# Patient Record
Sex: Female | Born: 1990 | Race: Black or African American | Hispanic: No | Marital: Single | Smoking: Never smoker
Health system: Southern US, Community
[De-identification: ages and names within clinical notes are randomized; demographics above are authoritative.]

## PROBLEM LIST (undated history)

## (undated) DIAGNOSIS — IMO0001 Reserved for inherently not codable concepts without codable children: Secondary | ICD-10-CM

## (undated) DIAGNOSIS — D649 Anemia, unspecified: Secondary | ICD-10-CM

## (undated) DIAGNOSIS — G43009 Migraine without aura, not intractable, without status migrainosus: Secondary | ICD-10-CM

## (undated) DIAGNOSIS — Z8619 Personal history of other infectious and parasitic diseases: Secondary | ICD-10-CM

## (undated) DIAGNOSIS — F32A Depression, unspecified: Secondary | ICD-10-CM

## (undated) DIAGNOSIS — E559 Vitamin D deficiency, unspecified: Secondary | ICD-10-CM

## (undated) DIAGNOSIS — E349 Endocrine disorder, unspecified: Secondary | ICD-10-CM

## (undated) DIAGNOSIS — E282 Polycystic ovarian syndrome: Secondary | ICD-10-CM

## (undated) DIAGNOSIS — F329 Major depressive disorder, single episode, unspecified: Secondary | ICD-10-CM

## (undated) HISTORY — DX: Reserved for inherently not codable concepts without codable children: IMO0001

## (undated) HISTORY — DX: Personal history of other infectious and parasitic diseases: Z86.19

## (undated) HISTORY — DX: Endocrine disorder, unspecified: E34.9

## (undated) HISTORY — DX: Depression, unspecified: F32.A

## (undated) HISTORY — PX: COLPOSCOPY: SHX161

## (undated) HISTORY — DX: Vitamin D deficiency, unspecified: E55.9

## (undated) HISTORY — PX: MOUTH SURGERY: SHX715

## (undated) HISTORY — DX: Major depressive disorder, single episode, unspecified: F32.9

## (undated) HISTORY — DX: Migraine without aura, not intractable, without status migrainosus: G43.009

## (undated) HISTORY — DX: Polycystic ovarian syndrome: E28.2

---

## 2010-12-20 ENCOUNTER — Emergency Department: Admit: 2010-12-20 | Payer: Self-pay | Source: Emergency Department | Admitting: Emergency Medicine

## 2010-12-26 ENCOUNTER — Emergency Department: Admit: 2010-12-26 | Payer: Self-pay | Source: Emergency Department | Admitting: Emergency Medicine

## 2013-08-23 ENCOUNTER — Other Ambulatory Visit: Payer: Self-pay | Admitting: Orthopaedic Surgery

## 2014-05-11 ENCOUNTER — Emergency Department
Admission: EM | Admit: 2014-05-11 | Discharge: 2014-05-11 | Disposition: A | Discharge status: Home / Self Care | Payer: BLUE CROSS/BLUE SHIELD | Attending: Emergency Medicine | Admitting: Emergency Medicine

## 2014-05-11 ENCOUNTER — Emergency Department: Payer: Self-pay

## 2014-05-11 ENCOUNTER — Emergency Department: Payer: BLUE CROSS/BLUE SHIELD

## 2014-05-11 DIAGNOSIS — R109 Unspecified abdominal pain: Secondary | ICD-10-CM

## 2014-05-11 DIAGNOSIS — D72829 Elevated white blood cell count, unspecified: Secondary | ICD-10-CM | POA: Insufficient documentation

## 2014-05-11 DIAGNOSIS — R1013 Epigastric pain: Secondary | ICD-10-CM | POA: Insufficient documentation

## 2014-05-11 DIAGNOSIS — R1110 Vomiting, unspecified: Secondary | ICD-10-CM | POA: Insufficient documentation

## 2014-05-11 LAB — CBC AND DIFFERENTIAL
Basophils Absolute Automated: 0.01 10*3/uL (ref 0.00–0.20)
Basophils Automated: 0 %
Eosinophils Absolute Automated: 0 10*3/uL (ref 0.00–0.70)
Eosinophils Automated: 0 %
Hematocrit: 44.9 % (ref 37.0–47.0)
Hgb: 15.6 g/dL (ref 12.0–16.0)
Immature Granulocytes Absolute: 0.03 10*3/uL
Immature Granulocytes: 0 %
Lymphocytes Absolute Automated: 1.07 10*3/uL (ref 0.50–4.40)
Lymphocytes Automated: 7 %
MCH: 31 pg (ref 28.0–32.0)
MCHC: 34.7 g/dL (ref 32.0–36.0)
MCV: 89.3 fL (ref 80.0–100.0)
MPV: 11 fL (ref 9.4–12.3)
Monocytes Absolute Automated: 0.51 10*3/uL (ref 0.00–1.20)
Monocytes: 3 %
Neutrophils Absolute: 13.82 10*3/uL — ABNORMAL HIGH (ref 1.80–8.10)
Neutrophils: 90 %
Nucleated RBC: 0 /100 WBC (ref 0–1)
Platelets: 228 10*3/uL (ref 140–400)
RBC: 5.03 10*6/uL (ref 4.20–5.40)
RDW: 13 % (ref 12–15)
WBC: 15.41 10*3/uL — ABNORMAL HIGH (ref 3.50–10.80)

## 2014-05-11 LAB — COMPREHENSIVE METABOLIC PANEL
ALT: 50 U/L (ref 0–55)
AST (SGOT): 40 U/L — ABNORMAL HIGH (ref 5–34)
Albumin/Globulin Ratio: 1.4 (ref 0.9–2.2)
Albumin: 4.9 g/dL (ref 3.5–5.0)
Alkaline Phosphatase: 106 U/L (ref 37–106)
Anion Gap: 14 (ref 5.0–15.0)
BUN: 19 mg/dL (ref 7.0–19.0)
Bilirubin, Total: 0.7 mg/dL (ref 0.2–1.2)
CO2: 24 mEq/L (ref 22–29)
Calcium: 10.7 mg/dL — ABNORMAL HIGH (ref 8.5–10.5)
Chloride: 102 mEq/L (ref 100–111)
Creatinine: 1 mg/dL (ref 0.6–1.0)
Globulin: 3.6 g/dL (ref 2.0–3.6)
Glucose: 111 mg/dL — ABNORMAL HIGH (ref 70–100)
Potassium: 3.5 mEq/L (ref 3.5–5.1)
Protein, Total: 8.5 g/dL — ABNORMAL HIGH (ref 6.0–8.3)
Sodium: 140 mEq/L (ref 136–145)

## 2014-05-11 LAB — POCT PREGNANCY TEST, URINE HCG: POCT Pregnancy HCG Test, UR: NEGATIVE

## 2014-05-11 LAB — POCT URINALYSIS AUTOMATED (IAH)
Glucose, UA POCT: NEGATIVE
Ketones, UA POCT: >=160 mg/dL — AB
Nitrite, UA POCT: NEGATIVE
PH, UA POCT: 6.5 (ref 4.6–8)
Protein, UA POCT: >=300 mg/dL — AB
Specific Gravity, UA POCT: >=1.03 mg/dL (ref 1.001–1.035)
Urine Leukocytes POCT: NEGATIVE
Urobilinogen, UA POCT: 1 mg/dL

## 2014-05-11 LAB — HEMOLYSIS INDEX: Hemolysis Index: 17 (ref 0–18)

## 2014-05-11 LAB — LIPASE: Lipase: 43 U/L (ref 8–78)

## 2014-05-11 LAB — GFR: EGFR: >60

## 2014-05-11 LAB — HCG QUANTITATIVE: hCG, Quant.: <1.2

## 2014-05-11 MED ORDER — IOHEXOL 350 MG/ML IV SOLN
100.0000 mL | Freq: Once | INTRAVENOUS | Status: AC | PRN
Start: 2014-05-11 — End: 2014-05-11
  Administered 2014-05-11: 100 mL via INTRAVENOUS

## 2014-05-11 MED ORDER — SODIUM CHLORIDE 0.9 % IV BOLUS
1000.0000 mL | Freq: Once | INTRAVENOUS | Status: DC
Start: 2014-05-11 — End: 2014-05-11

## 2014-05-11 MED ORDER — ACETAMINOPHEN-CODEINE 300-30 MG PO TABS
1.0000 | ORAL_TABLET | Freq: Four times a day (QID) | ORAL | Status: DC | PRN
Start: 2014-05-11 — End: 2015-08-17

## 2014-05-11 MED ORDER — MORPHINE SULFATE 2 MG/ML IJ/IV SOLN (WRAP)
4.0000 mg | Freq: Once | Status: AC
Start: 2014-05-11 — End: 2014-05-11
  Administered 2014-05-11: 4 mg via INTRAVENOUS
  Filled 2014-05-11: qty 2

## 2014-05-11 MED ORDER — ONDANSETRON 4 MG PO TBDP
4.0000 mg | ORAL_TABLET | Freq: Four times a day (QID) | ORAL | Status: AC | PRN
Start: 2014-05-11 — End: ?

## 2014-05-11 MED ORDER — SODIUM CHLORIDE 0.9 % IV BOLUS
2000.0000 mL | Freq: Once | INTRAVENOUS | Status: AC
Start: 2014-05-11 — End: 2014-05-11
  Administered 2014-05-11: 2000 mL via INTRAVENOUS

## 2014-05-11 MED ORDER — ONDANSETRON HCL 4 MG/2ML IJ SOLN
4.0000 mg | Freq: Once | INTRAMUSCULAR | Status: AC
Start: 2014-05-11 — End: 2014-05-11
  Administered 2014-05-11: 4 mg via INTRAVENOUS
  Filled 2014-05-11: qty 2

## 2014-05-11 MED ORDER — ACETAMINOPHEN-CODEINE #3 300-30 MG PO TABS
1.0000 | ORAL_TABLET | Freq: Once | ORAL | Status: AC
Start: 2014-05-11 — End: 2014-05-11
  Administered 2014-05-11: 1 via ORAL
  Filled 2014-05-11: qty 1

## 2014-05-11 NOTE — ED Provider Notes (Signed)
EMERGENCY DEPARTMENT HISTORY AND PHYSICAL EXAM     Physician/Midlevel provider first contact with patient: 05/11/14 1855         Date: 05/11/2014  Patient Name: Natalie Noble    History of Presenting Illness     Chief Complaint   Patient presents with   . Abdominal Pain   . Abnormal Lab   . Emesis       History Provided By: Patient, pt's mother    Chief Complaint: Abdominal pain  Onset: x 2 days  Timing: Constant  Location: Epigastrium  Severity: Moderate  Modifying Factors: None  Associated Symptoms: nausea, chills, loss of appetite, emesis    Additional History: Natalie Noble is a 23 y.o. female with PMHx of GERD c/o epigastric abdominal pain since x 2 days. She notes chills, loss of appetite, emesis x 2 days but denies any fever, diarrhea, or syncope. The patient was given Nexium at urgent care yesterday, but hasn't been able to take it due to nausea.    Patient is allergic to Erythromycin. Her family goes to [facility]. No chance of pregnancy noted by mother.    No IVDU. Pt smokes.     PCP: No primary care provider on file.      No current facility-administered medications for this encounter.     Current Outpatient Prescriptions   Medication Sig Dispense Refill   . Acetaminophen-Codeine (TYLENOL/CODEINE #3) 300-30 MG per tablet Take 1-2 tablets by mouth every 6 (six) hours as needed for Pain (as needed for pain or cough). 8 tablet 0   . ondansetron (ZOFRAN ODT) 4 MG disintegrating tablet Take 1 tablet (4 mg total) by mouth every 6 (six) hours as needed for Nausea. 8 tablet 0       Past History     Past Medical History:  Past Medical History   Diagnosis Date   . Reflux        Past Surgical History:  History reviewed. No pertinent past surgical history.    Family History:  History reviewed. No pertinent family history.    Social History:  History   Substance Use Topics   . Smoking status: Current Every Day Smoker   . Smokeless tobacco: Not on file   . Alcohol Use: Yes       Allergies:  Allergies    Allergen Reactions   . Erythromycin        Review of Systems     Review of Systems   Constitutional: Positive for chills and malaise/fatigue. Negative for fever.   HENT: Negative for nosebleeds.    Eyes: Negative for redness.   Respiratory: Negative for cough.    Cardiovascular: Negative for chest pain and leg swelling.   Gastrointestinal: Positive for nausea and vomiting. Negative for diarrhea.   Genitourinary: Negative for dysuria, urgency, frequency, hematuria and flank pain.   Musculoskeletal: Negative for myalgias and back pain.   Skin: Negative for rash.   Neurological: Positive for weakness. Negative for tremors, loss of consciousness and headaches.   Endo/Heme/Allergies: Negative for environmental allergies and polydipsia.   Psychiatric/Behavioral: Positive for substance abuse (tobacco). Negative for depression and suicidal ideas.         Physical Exam   BP 156/89 mmHg  Pulse 60  Temp(Src) 97 F (36.1 C) (Oral)  Resp 18  Ht 1.626 m  Wt 58.968 kg  BMI 22.30 kg/m2  SpO2 98%  LMP 04/02/2014    Constitutional: Vital signs reviewed. Pt looks uncomfortable and vomiting.  Head: Normocephalic, atraumatic  Eyes: Conjunctiva and sclera are normal.  No injection or discharge.  Ears, Nose, Throat:  Normal external examination of the nose and ears.  Mucous membranes moist.  Neck: Normal range of motion. Supple, no meningeal signs. Trachea midline.  Respiratory/Chest: Clear to auscultation. No respiratory distress.   Cardiovascular: Regular rate and rhythm. No murmurs.  Abdomen:  Bowel sounds intact. No rebound or guarding. Soft.  (+) epigastric tenderness. No tenderness elsewhere.  Back: No cva tenderness to percussion.  Upper Extremity:  No edema. No cyanosis. Bilateral radial pulses intact and equal.   Lower Extremity:  No edema. No cyanosis. Bilateral calves symmetrical and non-tender.   Skin: Warm and dry. No rash.  Neuro: A&Ox3. Moves all extremities spontaneously. Normal gait.   Psychiatric: Normal  affect.  Normal insight.      Diagnostic Study Results     Labs -     Results    Procedure Component Value Units Date/Time    UA POC (POCT UA Clinitek AX) [[id number]]  (Abnormal) Collected:  05/11/14 2046     Color UA POCT Amber Updated:  05/11/14 2047     Clarity UA POCT Clear      Glucose, UA POCT Negative      Bilirubin, UA POCT Small (A)      Ketones, UA POCT >=160 (A) mg/dL      Specific Gravity, UA POCT >=1.030 mg/dL      Blood, UA POCT  Small (A)      PH, UA POCT 6.5      Protein, UA POCT >=300 (A) mg/dL      Urobilinogen, UA POCT 1.0 mg/dL      Nitrite, UA POCT Negative      Leukocytes, UA POCT Negative     Urine HCG POC [[id number]] Collected:  05/11/14 2046     POCT QC Pass Updated:  05/11/14 2046     POCT Pregnancy HCG Test, UR Negative      Comment:        Result:        Negative Value is Normal in Healthy Males or Healthy non-pregnant Females    Beta HCG Quant Serum [[id number]] Collected:  05/11/14 1919     hCG, Quant. <1.2 Updated:  05/11/14 1950    Comprehensive Metabolic Panel (CMP) [[id number]]  (Abnormal) Collected:  05/11/14 1919    Specimen Information:  Blood Updated:  05/11/14 1945     Glucose 111 (H) mg/dL      BUN 84.6 mg/dL      Creatinine 1.0 mg/dL      Sodium 962 mEq/L      Potassium 3.5 mEq/L      Chloride 102 mEq/L      CO2 24 mEq/L      CALCIUM 10.7 (H) mg/dL      Protein, Total 8.5 (H) g/dL      Albumin 4.9 g/dL      AST (SGOT) 40 (H) U/L      ALT 50 U/L      Alkaline Phosphatase 106 U/L      Bilirubin, Total 0.7 mg/dL      Globulin 3.6 g/dL      Albumin/Globulin Ratio 1.4      Anion Gap 14.0     Lipase [[id number]] Collected:  05/11/14 1919    Specimen Information:  Blood Updated:  05/11/14 1945     Lipase 43 U/L     Hemolysis index [[id number]] Collected:  05/11/14 1919  Hemolysis Index 17 Updated:  05/11/14 1945    GFR [[id number]] Collected:  05/11/14 1919     EGFR >60.0 Updated:  05/11/14 1945    CBC and differential [[id number]]  (Abnormal) Collected:  05/11/14 1919    Specimen Information:   Blood / Blood Updated:  05/11/14 1927     WBC 15.41 (H) x10 3/uL      RBC 5.03 x10 6/uL      Hgb 15.6 g/dL      Hematocrit 69.6 %      MCV 89.3 fL      MCH 31.0 pg      MCHC 34.7 g/dL      RDW 13 %      Platelets 228 x10 3/uL      MPV 11.0 fL      Neutrophils 90 %      Lymphocytes Automated 7 %      Monocytes 3 %      Eosinophils Automated 0 %      Basophils Automated 0 %      Immature Granulocyte 0 %      Nucleated RBC 0 /100 WBC      Neutrophils Absolute 13.82 (H) x10 3/uL      Abs Lymph Automated 1.07 x10 3/uL      Abs Mono Automated 0.51 x10 3/uL      Abs Eos Automated 0.00 x10 3/uL      Absolute Baso Automated 0.01 x10 3/uL      Absolute Immature Granulocyte 0.03 x10 3/uL           Radiologic Studies -   Radiology Results (24 Hour)    Procedure Component Value Units Date/Time    CT Abd/ Pelvis with IV Contrast [[id number]] Collected:  05/11/14 2053    Order Status:  Completed Updated:  05/11/14 2100    Narrative:      CT ABDOMEN PELVIS W IV/ WO PO CONT    CLINICAL INDICATION: diffuse abdominal pain and vomiting. Hcg pending    COMPARISON: None available.    TECHNIQUE: Helical CT scan through the abdomen and pelvis was obtained  from the dome of the diaphragm to the symphysis pubis after the  uneventful administration of 100 cc of nonionic intravenous Omnipaque  350 and oral contrast.    FINDINGS:    . The liver, spleen, gallbladder, pancreas, adrenal glands,  and kidneys are normal for age.             There is no bowel obstruction or abnormal bowel dilatation. The  appendix is normal.    There is no ascites or abnormal fluid collection.      The abdominal aorta and common iliac arteries Are within normal limits  for age. No retroperitoneal mass or adenopathy is present. The lumbar  spine is within normal limits for age.      The urinary bladder is empty.. The uterus and ovaries are not  visualized.            Impression:       No evidence of acute intraperitoneal process.    Heron Nay, MD   05/11/2014 8:55  PM        .      Medical Decision Making   I am the first provider for this patient.    I reviewed the vital signs, available nursing notes, past medical history, past surgical history, family history and social history.    Vital Signs-Reviewed the patient's vital signs.  No data found.      Pulse Oximetry Analysis - Normal 98% on RA    Old Medical Records: Nursing notes.     ED Course:     9:11 PM - Patient states she is feeling better. Abdomen non-tender on repeat exam and pt tolerating PO. Discussed test results with pt and counseled on diagnosis, f/u plans, and signs and symptoms when to return to ED.  Pt is stable and ready for discharge.        Provider Notes: 80 yof presenting to ED with epigastric abdominal pain, vomiting, and leukocytosis. Felt much better after IV fluids, pain meds, and antiemetics. No acute process on CT abdomen. Abdominal pain resolved in the ED and she was tolerating PO intake. Will f/u with PMD and GI Monday (referral provided). Will f/u with PCP in 1-2 days. Reviewed red flags for return to ED and pt and mother voiced understanding.      Diagnosis     Clinical Impression:   1. Abdominal pain, unspecified abdominal location        _______________________________    Attestations:  This note is prepared by Selina Cooley acting as a Scribe for Maryella Shivers, MD.    Maryella Shivers, MD.  The scribe's documentation has been prepared under my direction and personally reviewed by me in its entirety.  I confirm that the note above accurately reflects all work, treatment, procedures, and medical decision making performed by me.    _______________________________          Maryella Shivers, MD  05/13/14 [phone]

## 2014-05-11 NOTE — Discharge Instructions (Signed)
Abdominal Pain     You have been diagnosed with abdominal (belly) pain. The cause of your pain is not yet known.     Many things can cause abdominal pain. Examples include viral infections and bowel (intestine) spasms. You might need another examination or more tests to find out why you have pain.     At this time, your pain does not seem to be caused by anything dangerous. You do not need surgery. You do not need to stay in the hospital.      Though we don’t believe your condition is dangerous right now, it is important to be careful. Sometimes a problem that seems mild can become serious later. This is why it is very important that you return here or go to the nearest Emergency Department unless you are 100% improved.     Return here or go to the nearest Emergency Department, or follow up with your physician in:  · 24 hours.     Drink only clear liquids such as water, clear broth, sports drinks, or clear caffeine-free soft drinks, like 7-Up or Sprite, for the next:  · 24 hours.     YOU SHOULD SEEK MEDICAL ATTENTION IMMEDIATELY, EITHER HERE OR AT THE NEAREST EMERGENCY DEPARTMENT, IF ANY OF THE FOLLOWING OCCURS:  · Your pain does not go away or gets worse.  · You cannot keep fluids down or your vomit is dark green.   · You vomit blood or see blood in your stool. Blood might be bright red or dark red. It can also be black and look like tar.  · You have a fever (temperature higher than 100.4ºF / 38ºC) or shaking chills.  · Your skin or eyes look yellow or your urine looks brown.  · You have severe diarrhea.

## 2014-05-11 NOTE — ED Notes (Addendum)
2 days of vomiting, unable to keep anything down, c/o black colored emesis, pt states mid epigastric tenderness, appears uncomfortable, came from clinic, elevated WBCs

## 2014-07-30 ENCOUNTER — Other Ambulatory Visit: Payer: Self-pay | Admitting: Specialist

## 2014-07-30 DIAGNOSIS — N6459 Other signs and symptoms in breast: Secondary | ICD-10-CM

## 2014-07-30 DIAGNOSIS — G4452 New daily persistent headache (NDPH): Secondary | ICD-10-CM

## 2015-02-16 ENCOUNTER — Emergency Department
Admission: EM | Admit: 2015-02-16 | Discharge: 2015-02-16 | Disposition: A | Discharge status: Home / Self Care | Payer: BLUE CROSS/BLUE SHIELD | Attending: Emergency Medicine | Admitting: Emergency Medicine

## 2015-02-16 ENCOUNTER — Emergency Department: Payer: BLUE CROSS/BLUE SHIELD

## 2015-02-16 DIAGNOSIS — K2970 Gastritis, unspecified, without bleeding: Secondary | ICD-10-CM | POA: Insufficient documentation

## 2015-02-16 DIAGNOSIS — K219 Gastro-esophageal reflux disease without esophagitis: Secondary | ICD-10-CM | POA: Insufficient documentation

## 2015-02-16 DIAGNOSIS — R112 Nausea with vomiting, unspecified: Secondary | ICD-10-CM

## 2015-02-16 DIAGNOSIS — F17210 Nicotine dependence, cigarettes, uncomplicated: Secondary | ICD-10-CM | POA: Insufficient documentation

## 2015-02-16 DIAGNOSIS — R1013 Epigastric pain: Secondary | ICD-10-CM

## 2015-02-16 LAB — POCT URINALYSIS AUTOMATED (IAH)
Glucose, UA POCT: NEGATIVE
Ketones, UA POCT: >=160 mg/dL — AB
Nitrite, UA POCT: NEGATIVE
PH, UA POCT: 7 (ref 4.6–8)
Protein, UA POCT: 30 mg/dL — AB
Specific Gravity, UA POCT: 1.025 mg/dL (ref 1.001–1.035)
Urine Leukocytes POCT: NEGATIVE
Urobilinogen, UA POCT: 0.2 mg/dL

## 2015-02-16 LAB — CBC AND DIFFERENTIAL
Basophils Absolute Automated: 0.01 10*3/uL (ref 0.00–0.20)
Basophils Automated: 0 %
Eosinophils Absolute Automated: 0 10*3/uL (ref 0.00–0.70)
Eosinophils Automated: 0 %
Hematocrit: 46.1 % (ref 37.0–47.0)
Hgb: 15.8 g/dL (ref 12.0–16.0)
Immature Granulocytes Absolute: 0.04 10*3/uL
Immature Granulocytes: 0 %
Lymphocytes Absolute Automated: 1.24 10*3/uL (ref 0.50–4.40)
Lymphocytes Automated: 9 %
MCH: 31.3 pg (ref 28.0–32.0)
MCHC: 34.3 g/dL (ref 32.0–36.0)
MCV: 91.3 fL (ref 80.0–100.0)
MPV: 11.2 fL (ref 9.4–12.3)
Monocytes Absolute Automated: 0.36 10*3/uL (ref 0.00–1.20)
Monocytes: 3 %
Neutrophils Absolute: 11.99 10*3/uL — ABNORMAL HIGH (ref 1.80–8.10)
Neutrophils: 88 %
Nucleated RBC: 0 /100 WBC (ref 0–1)
Platelets: 208 10*3/uL (ref 140–400)
RBC: 5.05 10*6/uL (ref 4.20–5.40)
RDW: 12 % (ref 12–15)
WBC: 13.6 10*3/uL — ABNORMAL HIGH (ref 3.50–10.80)

## 2015-02-16 LAB — COMPREHENSIVE METABOLIC PANEL
ALT: 36 U/L (ref 0–55)
AST (SGOT): 22 U/L (ref 5–34)
Albumin/Globulin Ratio: 1.4 (ref 0.9–2.2)
Albumin: 4.4 g/dL (ref 3.5–5.0)
Alkaline Phosphatase: 87 U/L (ref 37–106)
Anion Gap: 12 (ref 5.0–15.0)
BUN: 16 mg/dL (ref 7.0–19.0)
Bilirubin, Total: 0.7 mg/dL (ref 0.2–1.2)
CO2: 25 mEq/L (ref 22–29)
Calcium: 10.2 mg/dL (ref 8.5–10.5)
Chloride: 104 mEq/L (ref 100–111)
Creatinine: 0.9 mg/dL (ref 0.6–1.0)
Globulin: 3.1 g/dL (ref 2.0–3.6)
Glucose: 90 mg/dL (ref 70–100)
Potassium: 3.7 mEq/L (ref 3.5–5.1)
Protein, Total: 7.5 g/dL (ref 6.0–8.3)
Sodium: 141 mEq/L (ref 136–145)

## 2015-02-16 LAB — POCT PREGNANCY TEST, URINE HCG: POCT Pregnancy HCG Test, UR: NEGATIVE

## 2015-02-16 LAB — GFR: EGFR: >60

## 2015-02-16 LAB — LIPASE: Lipase: 29 U/L (ref 8–78)

## 2015-02-16 LAB — HEMOLYSIS INDEX: Hemolysis Index: 6 (ref 0–18)

## 2015-02-16 MED ORDER — SODIUM CHLORIDE 0.9 % IV BOLUS
1000.0000 mL | Freq: Once | INTRAVENOUS | Status: AC
Start: 2015-02-16 — End: 2015-02-16
  Administered 2015-02-16: 1000 mL via INTRAVENOUS

## 2015-02-16 MED ORDER — FAMOTIDINE 10 MG/ML IV SOLN (WRAP)
20.0000 mg | Freq: Once | INTRAVENOUS | Status: AC
Start: 2015-02-16 — End: 2015-02-16
  Administered 2015-02-16: 20 mg via INTRAVENOUS
  Filled 2015-02-16: qty 2

## 2015-02-16 MED ORDER — PROMETHAZINE HCL 25 MG PO TABS
12.5000 mg | ORAL_TABLET | Freq: Four times a day (QID) | ORAL | Status: AC | PRN
Start: 2015-02-16 — End: ?

## 2015-02-16 MED ORDER — ONDANSETRON HCL 4 MG/2ML IJ SOLN
4.0000 mg | Freq: Once | INTRAMUSCULAR | Status: AC
Start: 2015-02-16 — End: 2015-02-16
  Administered 2015-02-16: 4 mg via INTRAVENOUS
  Filled 2015-02-16: qty 2

## 2015-02-16 MED ORDER — PROMETHAZINE HCL 25 MG/ML IJ SOLN
12.5000 mg | Freq: Once | INTRAMUSCULAR | Status: AC
Start: 2015-02-16 — End: 2015-02-16
  Administered 2015-02-16: 12.5 mg via INTRAVENOUS
  Filled 2015-02-16: qty 1

## 2015-02-16 MED ORDER — ESOMEPRAZOLE MAGNESIUM 20 MG PO CPDR
20.0000 mg | DELAYED_RELEASE_CAPSULE | Freq: Every morning | ORAL | Status: AC
Start: 2015-02-16 — End: ?

## 2015-02-16 MED ORDER — TRAMADOL HCL 50 MG PO TABS
50.0000 mg | ORAL_TABLET | Freq: Four times a day (QID) | ORAL | Status: DC | PRN
Start: 2015-02-16 — End: 2015-08-17

## 2015-02-16 NOTE — Discharge Instructions (Signed)
Gastritis    You have been diagnosed with gastritis.    Gastritis is an irritation of the stomach lining. It has a number of causes including the use of aspirin and other anti-inflammatory medicines like ibuprofen (Advil or Motrin), naproxen (Naprosyn or Aleve), etc. Other causes are infections and too much stomach acid. Drinking alcohol can also cause gastritis.    You may be prescribed medicines to lower the amount of acid in the stomach or otherwise protect the stomach lining.    Use an antacid (Maalox and Mylanta) as directed on the bottle (1-2 tablespoons or 5-10 ml four times a day).    Avoid caffeine and alcohol. Avoid aspirin and other anti-inflammatory medicines like ibuprofen (Advil or Motrin), naproxen (Naprosyn or Aleve), etc. Acetaminophen (Tylenol) is safe for your stomach.    Spicy foods and acidic foods may increase pain. However, they do not damage the stomach lining. Avoid these foods if they cause pain.    You may be referred to a stomach specialist (Gastroenterologist) for further evaluation.    YOU SHOULD SEEK MEDICAL ATTENTION IMMEDIATELY, EITHER HERE OR AT THE NEAREST EMERGENCY DEPARTMENT, IF ANY OF THE FOLLOWING OCCURS:   Your pain suddenly gets worse.   You vomit (throw up) repeatedly or vomit blood or material that looks like "coffee grounds."   Any blood is in your stool or your stool gets very dark or looks like tar.   You develop a fever (temperature higher than 100.4F / 38C) or shaking chills.              Epigastric Abdominal Pain (Cause Unspecified)    You were treated for epigastric abdominal (belly) pain. We don t know the cause of the pain yet.    Epigastric pain is located in the center of your upper belly right under your ribs.     There are several common causes of epigastric abdominal pain. These may include:     Gastroesophageal reflux disease (GERD) or heartburn: This is the most common cause. It usually happens right after eating.       Gastritis: This is inflammation of your stomach.     Lactose Intolerance: This means the body can t digest lactose. This is found in milk and other dairy products.     Pancreatitis: This is when your pancreas gets inflamed. Often the pain can be felt in the back.     Ulcers in your stomach or intestine.    Your doctor diagnosed your condition by your physical exam and your history. You may have also had imaging or blood work done to make sure you don't have any serious problems.     Treatment of epigastric abdominal pain focuses on making symptoms better. Fluids and electrolytes (sodium and potassium) may be given through an IV. Pain medications may be given.     Your doctor may give you something to lower acid production or coat the stomach lining.    Acid Reducing Medications - These medications lower the amount of acid made in your stomach. This helps the inflammation in the stomach lining heal. Scheduling and dosing can vary, so follow the instructions carefully. Two of the common types of antacid medications are H2 blockers and proton pump inhibitors (PPIs). They include:    - H2 famotidine (Pepcid)  - H2 ranitidine (Zantac)  - PPI omeprazole (Prilosec)  - PPI lansoprazole (Prevacid)  - PPI esomeprazole (Nexium)  - PPI pantoprazole (Protonix)    Acid protective medications - These stick   to damaged ulcer tissue and protect against acid and enzymes. This way, the ulcer can heal.     - Sucralafate (Carafate).    You have been evaluated, treated, and observed by your doctor.Your doctor feels thatyour condition has stabilized and it is safe for you to go home.    Though we don't believe your condition is dangerous right now, it is important to be careful. Sometimes a problem that seems mild can become serious later. This is why it is very important that you return here or go to the nearest Emergency Department if you are not improving or your symptoms are getting worse.    Your  doctor may have you follow up with your primary care doctor as an outpatient to check your condition. Follow up as directed.    Your doctor may prescribe you pain medications to treat yourpain. You can use over-the-counter medicines like acetaminophen (Tylenol). It is important to follow the directions for taking these medications.    Stool softeners - These medications help with the constipation caused by narcotic medications. You can use over-the-counter laxatives like milk of magnesia or magnesium citrate.    YOU SHOULD SEEK MEDICAL ATTENTION IMMEDIATELY, EITHER HERE OR AT THE NEAREST EMERGENCY DEPARTMENT, IF ANY OF THE FOLLOWING OCCUR:   You have sudden severe pain in your belly or chest.   Your pain gets worse or does not go away.   You throw up blood or see blood in your stool. Blood may be bright red or dark black and tarry.   Your skin or eyes look yellow or your urine (pee) looks dark brown.   You get a fever (temperature higher than 100.4F or 38C) or shaking chills.     If you can t follow up with your doctor, or if at any time you feel you need to be rechecked or seen again, come back here or go to the nearest emergency department.

## 2015-02-16 NOTE — ED Provider Notes (Signed)
EMERGENCY DEPARTMENT HISTORY AND PHYSICAL EXAM     Physician/Midlevel provider first contact with patient: 02/16/15 2002         Date: 02/16/2015  Patient Name: Natalie Noble    History of Presenting Illness     Chief Complaint   Patient presents with   . Emesis     History Provided By: Patient    Chief Complaint: Nausea and vomiting  Onset: x 4 days  Timing: Constant  Severity: Moderate  Modifying Factors: None   Associated Symptoms: Subjective fever, abd pain, decreased appetite, mild CP, mild SOB, cough,     Additional History: Natalie Noble is a 24 y.o. female. C/O moderate constant nausea and vomiting that has been going on x 4 days, associated with abd pain, subjective fever, decreased appetite, mild CP, mild SOB, and cough. She states that her abd pain is epigastric. She states that she has had similar episodes of this in the past and it was a stomach bug. She denies having HA, dysuria, or hematuria. Pt is on depo. She states she smokes and drinks almost every day. No modifying factors.     PCP: No primary care provider on file.      No current facility-administered medications for this encounter.     Current Outpatient Prescriptions   Medication Sig Dispense Refill   . Acetaminophen-Codeine (TYLENOL/CODEINE #3) 300-30 MG per tablet Take 1-2 tablets by mouth every 6 (six) hours as needed for Pain (as needed for pain or cough). 8 tablet 0   . esomeprazole (NEXIUM) 20 MG capsule Take 1 capsule (20 mg total) by mouth every morning before breakfast. 30 capsule 0   . ondansetron (ZOFRAN ODT) 4 MG disintegrating tablet Take 1 tablet (4 mg total) by mouth every 6 (six) hours as needed for Nausea. 8 tablet 0   . promethazine (PHENERGAN) 25 MG tablet Take 0.5-1 tablets (12.5-25 mg total) by mouth every 6 (six) hours as needed for Nausea. 12 tablet 0   . traMADol (ULTRAM) 50 MG tablet Take 1 tablet (50 mg total) by mouth every 6 (six) hours as needed for Pain. Do not drive or operate machinery while taking this  medication 20 tablet 0       Past History     Past Medical History:  Past Medical History   Diagnosis Date   . Reflux        Past Surgical History:  History reviewed. No pertinent past surgical history.    Family History:  History reviewed. No pertinent family history.    Social History:  History   Substance Use Topics   . Smoking status: Current Every Day Smoker   . Smokeless tobacco: Not on file   . Alcohol Use: Yes       Allergies:  Allergies   Allergen Reactions   . Erythromycin        Review of Systems     Review of Systems   Constitutional: Positive for fever.   HENT: Negative for nosebleeds.    Eyes: Negative for discharge and redness.   Respiratory: Positive for shortness of breath (Mild).    Cardiovascular: Positive for chest pain (Mild).   Gastrointestinal: Positive for nausea, vomiting and abdominal pain.   Genitourinary: Negative for dysuria and hematuria.   Musculoskeletal: Positive for joint pain (for which pt takes ibuprofen).   Neurological: Negative for headaches.   Endo/Heme/Allergies:        Allergy to erythromycin   Psychiatric/Behavioral: Negative for suicidal ideas.  Physical Exam   BP 122/75 mmHg  Pulse 75  Temp(Src) 99.1 F (37.3 C)  Resp 16  Ht 5\' 4"  (1.626 m)  Wt 68.04 kg  BMI 25.73 kg/m2  SpO2 100%    Physical Exam   Constitutional: She is well-developed, well-nourished, and in no distress.   HENT:   Head: Normocephalic and atraumatic.   Mouth/Throat: Oropharynx is clear and moist.   Eyes: Conjunctivae are normal. Pupils are equal, round, and reactive to light.   Cardiovascular: Normal rate, regular rhythm and normal heart sounds.    No murmur heard.  Pulmonary/Chest: Effort normal and breath sounds normal. No respiratory distress.   Abdominal: Soft. She exhibits no distension. There is tenderness (mild epigastric ttp). There is no rebound and no guarding.   Musculoskeletal: Normal range of motion. She exhibits no edema.   Lymphadenopathy:     She has no cervical adenopathy.    Neurological: She is alert. GCS score is 15.   Skin: Skin is warm and dry.   Psychiatric: Mood and affect normal.   Nursing note and vitals reviewed.      Diagnostic Study Results     Labs -     Results     Procedure Component Value Units Date/Time    POCT Pregnancy Test, Urine HCG [[id number]] Collected:  02/16/15 2227     POCT QC Pass Updated:  02/16/15 2237     POCT Pregnancy HCG Test, UR Negative      Comment:        Result:        Negative Value is Normal in Healthy Males or Healthy non-pregnant Females    POCT UA Clinitek AX (urine dipstick) [[id number]]  (Abnormal) Collected:  02/16/15 2225     Color UA POCT Amber Updated:  02/16/15 2237     Clarity UA POCT Clear      Glucose, UA POCT Negative      Bilirubin, UA POCT Small (A)      Ketones, UA POCT >=160 (A) mg/dL      Specific Gravity, UA POCT 1.025 mg/dL      Blood, UA POCT  Trace - lysed (A)      PH, UA POCT 7.0      Protein, UA POCT =30 (A) mg/dL      Urobilinogen, UA POCT 0.2 mg/dL      Nitrite, UA POCT Negative      Leukocytes, UA POCT Negative     Comprehensive metabolic panel [[id number]] Collected:  02/16/15 2052    Specimen Information:  Blood Updated:  02/16/15 2116     Glucose 90 mg/dL      BUN 21.3 mg/dL      Creatinine 0.9 mg/dL      Sodium 086 mEq/L      Potassium 3.7 mEq/L      Chloride 104 mEq/L      CO2 25 mEq/L      CALCIUM 10.2 mg/dL      Protein, Total 7.5 g/dL      Albumin 4.4 g/dL      AST (SGOT) 22 U/L      ALT 36 U/L      Alkaline Phosphatase 87 U/L      Bilirubin, Total 0.7 mg/dL      Globulin 3.1 g/dL      Albumin/Globulin Ratio 1.4      Anion Gap 12.0     Lipase [[id number]] Collected:  02/16/15 2052    Specimen Information:  Blood Updated:  02/16/15 2116     Lipase 29 U/L     Hemolysis index [[id number]] Collected:  02/16/15 2052     Hemolysis Index 6 Updated:  02/16/15 2116    GFR [[id number]] Collected:  02/16/15 2052     EGFR >60.0 Updated:  02/16/15 2116    CBC with differential [[id number]]  (Abnormal) Collected:  02/16/15 2052     Specimen Information:  Blood / Blood Updated:  02/16/15 2102     WBC 13.60 (H) x10 3/uL      Hgb 15.8 g/dL      Hematocrit 46.9 %      Platelets 208 x10 3/uL      RBC 5.05 x10 6/uL      MCV 91.3 fL      MCH 31.3 pg      MCHC 34.3 g/dL      RDW 12 %      MPV 11.2 fL      Neutrophils 88 %      Lymphocytes Automated 9 %      Monocytes 3 %      Eosinophils Automated 0 %      Basophils Automated 0 %      Immature Granulocyte 0 %      Nucleated RBC 0 /100 WBC      Neutrophils Absolute 11.99 (H) x10 3/uL      Abs Lymph Automated 1.24 x10 3/uL      Abs Mono Automated 0.36 x10 3/uL      Abs Eos Automated 0.00 x10 3/uL      Absolute Baso Automated 0.01 x10 3/uL      Absolute Immature Granulocyte 0.04 x10 3/uL           Radiologic Studies -   Radiology Results (24 Hour)     ** No results found for the last 24 hours. **      .      Medical Decision Making   I am the first provider for this patient.    I reviewed the vital signs, available nursing notes, past medical history, past surgical history, family history and social history.    Vital Signs-Reviewed the patient's vital signs.     Patient Vitals for the past 12 hrs:   BP Temp Pulse Resp   02/16/15 2256 122/75 mmHg 99.1 F (37.3 C) 75 16   02/16/15 1953 158/89 mmHg 98.2 F (36.8 C) 60 17       Pulse Oximetry Analysis - Normal 100% on RA.    EKG:  Interpreted by the EP.   Time Interpreted: 2105   Rate: 56   Rhythm: Sinus bradycardia   Interpretation: no ischemic st segment abnormality, TWI V1-V3   Comparison: No prior study is available for comparison.    Old Medical Records: Old medical records.  Previous electrocardiograms.  Nursing notes.     CT abd/pelvis 05/2014 which was negative.     ED Course:   8:28 PM - Pt requesting Zofran before evaluation.  9:28 PM - Pt states she is feeling "so-so", but has no current requests. Will try to give urine sample.   11:15 PM - Pt is resting comfortably, discussed results, return precautions, and f/u plan. She agrees with plan.  Requests non NSAID medication for her knee pain.     Provider Notes:   Counseled on avoidance of exacerbating factors of gastritis - tobacco, alcohol, NSAID use    Procedures:    Core Measures:    Critical Care  Time:     Diagnosis     Clinical Impression:   1. Epigastric abdominal pain    2. Non-intractable vomiting with nausea, vomiting of unspecified type    3. Gastritis        _______________________________    This note is prepared by Rosine Abe acting as Scribe for Iva Boop, MD.    Iva Boop, MD.  The scribe's documentation has been prepared under my direction and personally reviewed by me in its entirety.  I confirm that the note above accurately reflects all work, treatment, procedures, and medical decision making performed by me.        _______________________________          Lorenza Cambridge, MD  02/20/15 [phone]

## 2015-02-17 LAB — ECG 12-LEAD
Atrial Rate: 56 {beats}/min
P Axis: 79 degrees
P-R Interval: 114 ms
Q-T Interval: 440 ms
QRS Duration: 82 ms
QTC Calculation (Bezet): 424 ms
R Axis: 72 degrees
T Axis: 23 degrees
Ventricular Rate: 56 {beats}/min

## 2015-07-01 ENCOUNTER — Encounter (INDEPENDENT_AMBULATORY_CARE_PROVIDER_SITE_OTHER): Payer: Self-pay

## 2015-07-01 NOTE — Progress Notes (Signed)
Individual Therapy Pre Screening Form  Date/Time: 07/01/2015, 6:15 PM  Interviewer: Lorain Childes    Patient Identification  Patient Name: Natalie Noble      Patient SSN: [id number]    Patient Phone Number(s):  [phone] (home)  , additional phone number:     Age:24 y.o.   DOB: [birth year]  Gender: female    Registration Related Information:   Planned Payment Method: Insurance    Referral Required: No  Have you confirmed your mental health benefits with your insurance? Yes    Referral Source: community  Can leave message?:  Yes    Why are you choosing to seek therapy at this time?: im going through a lot with my mom, i'm tired, constant fighting     Current support systems in life: no    Are you seeking long term therapy/more than 12 sessions:  No  Are you looking for court ordered or court recommended therapy? No  Are you wanting any type of evaluation/assessment completed? No  REFER OUT IF NECESSARY    Do you currently drink alcohol? Yes two or three times a week, two drinks    Do you currently or in the recent past use any non prescribed or illegal substances?  Marijuana once a week   Have you been in a program or seen a provider for substance abuse treatment including alcohol? No  REFER TO [facility]S IF APPROPRIATE    Do you ever worry about your memory?No  Have your family/friends ever expressed concern about your memory?No  REFER OUT IF APPROPRIATE    Do you currently have thoughts of hurting yourself?No  Have you had these thoughts in the past? Yes in the past     Are you currently on any medication? No  What are they?     Who is the prescribing doctor?     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    No    Time to call back: any  Referred out to: na  Therapist Assigned:   Appointment date and time

## 2015-08-16 ENCOUNTER — Observation Stay
Admission: EM | Admit: 2015-08-16 | Discharge: 2015-08-17 | Disposition: A | Discharge status: Home / Self Care | Payer: BLUE CROSS/BLUE SHIELD | Attending: Internal Medicine | Admitting: Internal Medicine

## 2015-08-16 ENCOUNTER — Observation Stay: Payer: BLUE CROSS/BLUE SHIELD | Admitting: Internal Medicine

## 2015-08-16 DIAGNOSIS — F1210 Cannabis abuse, uncomplicated: Secondary | ICD-10-CM | POA: Insufficient documentation

## 2015-08-16 DIAGNOSIS — F17200 Nicotine dependence, unspecified, uncomplicated: Secondary | ICD-10-CM | POA: Insufficient documentation

## 2015-08-16 DIAGNOSIS — R109 Unspecified abdominal pain: Secondary | ICD-10-CM | POA: Insufficient documentation

## 2015-08-16 DIAGNOSIS — R1115 Cyclical vomiting syndrome unrelated to migraine: Secondary | ICD-10-CM | POA: Diagnosis present

## 2015-08-16 DIAGNOSIS — R1013 Epigastric pain: Secondary | ICD-10-CM | POA: Insufficient documentation

## 2015-08-16 DIAGNOSIS — K219 Gastro-esophageal reflux disease without esophagitis: Secondary | ICD-10-CM | POA: Insufficient documentation

## 2015-08-16 DIAGNOSIS — G43A1 Cyclical vomiting, intractable: Principal | ICD-10-CM | POA: Insufficient documentation

## 2015-08-16 LAB — CBC AND DIFFERENTIAL
Basophils Absolute Automated: 0.01 10*3/uL (ref 0.00–0.20)
Basophils Automated: 0 %
Eosinophils Absolute Automated: 0 10*3/uL (ref 0.00–0.70)
Eosinophils Automated: 0 %
Hematocrit: 44.9 % (ref 37.0–47.0)
Hgb: 15.4 g/dL (ref 12.0–16.0)
Immature Granulocytes Absolute: 0.03 10*3/uL
Immature Granulocytes: 0 %
Lymphocytes Absolute Automated: 0.86 10*3/uL (ref 0.50–4.40)
Lymphocytes Automated: 7 %
MCH: 31.8 pg (ref 28.0–32.0)
MCHC: 34.3 g/dL (ref 32.0–36.0)
MCV: 92.6 fL (ref 80.0–100.0)
MPV: 10.7 fL (ref 9.4–12.3)
Monocytes Absolute Automated: 0.37 10*3/uL (ref 0.00–1.20)
Monocytes: 3 %
Neutrophils Absolute: 11.73 10*3/uL — ABNORMAL HIGH (ref 1.80–8.10)
Neutrophils: 90 %
Nucleated RBC: 0 /100 WBC (ref 0–1)
Platelets: 206 10*3/uL (ref 140–400)
RBC: 4.85 10*6/uL (ref 4.20–5.40)
RDW: 12 % (ref 12–15)
WBC: 12.97 10*3/uL — ABNORMAL HIGH (ref 3.50–10.80)

## 2015-08-16 LAB — URINALYSIS, REFLEX TO MICROSCOPIC EXAM IF INDICATED
Bilirubin, UA: NEGATIVE
Glucose, UA: NEGATIVE
Ketones UA: 20 — AB
Leukocyte Esterase, UA: NEGATIVE
Nitrite, UA: NEGATIVE
Protein, UR: 30 — AB
Specific Gravity UA: 1.02 (ref 1.001–1.035)
Urine pH: 7 (ref 5.0–8.0)
Urobilinogen, UA: NEGATIVE mg/dL

## 2015-08-16 LAB — COMPREHENSIVE METABOLIC PANEL
ALT: 43 U/L (ref 0–55)
AST (SGOT): 29 U/L (ref 5–34)
Albumin/Globulin Ratio: 1.5 (ref 0.9–2.2)
Albumin: 4.8 g/dL (ref 3.5–5.0)
Alkaline Phosphatase: 93 U/L (ref 37–106)
Anion Gap: 11 (ref 5.0–15.0)
BUN: 17 mg/dL (ref 7.0–19.0)
Bilirubin, Total: 0.7 mg/dL (ref 0.2–1.2)
CO2: 25 mEq/L (ref 22–29)
Calcium: 10.3 mg/dL (ref 8.5–10.5)
Chloride: 105 mEq/L (ref 100–111)
Creatinine: 0.8 mg/dL (ref 0.6–1.0)
Globulin: 3.1 g/dL (ref 2.0–3.6)
Glucose: 107 mg/dL — ABNORMAL HIGH (ref 70–100)
Potassium: 3.9 mEq/L (ref 3.5–5.1)
Protein, Total: 7.9 g/dL (ref 6.0–8.3)
Sodium: 141 mEq/L (ref 136–145)

## 2015-08-16 LAB — URINE HCG QUALITATIVE: Urine HCG Qualitative: NEGATIVE

## 2015-08-16 LAB — RAPID DRUG SCREEN, URINE
Barbiturate Screen, UR: NEGATIVE
Benzodiazepine Screen, UR: NEGATIVE
Cannabinoid Screen, UR: POSITIVE — AB
Cocaine, UR: NEGATIVE
Opiate Screen, UR: NEGATIVE
PCP Screen, UR: NEGATIVE
Urine Amphetamine Screen: NEGATIVE

## 2015-08-16 LAB — GFR: EGFR: >60

## 2015-08-16 LAB — HEMOLYSIS INDEX: Hemolysis Index: 10 (ref 0–18)

## 2015-08-16 LAB — LIPASE: Lipase: 50 U/L (ref 8–78)

## 2015-08-16 MED ORDER — FAMOTIDINE 10 MG/ML IV SOLN (WRAP)
20.0000 mg | Freq: Once | INTRAVENOUS | Status: AC
Start: 2015-08-16 — End: 2015-08-16
  Administered 2015-08-16: 20 mg via INTRAVENOUS
  Filled 2015-08-16: qty 2

## 2015-08-16 MED ORDER — FAMOTIDINE 20 MG PO TABS
20.0000 mg | ORAL_TABLET | Freq: Two times a day (BID) | ORAL | Status: DC
Start: 2015-08-16 — End: 2015-08-17
  Administered 2015-08-17: 20 mg via ORAL
  Filled 2015-08-16: qty 1

## 2015-08-16 MED ORDER — ONDANSETRON 4 MG PO TBDP
4.0000 mg | ORAL_TABLET | Freq: Three times a day (TID) | ORAL | Status: DC | PRN
Start: 2015-08-16 — End: 2015-08-17

## 2015-08-16 MED ORDER — SODIUM CHLORIDE 0.9 % IV BOLUS
1000.0000 mL | Freq: Once | INTRAVENOUS | Status: AC
Start: 2015-08-16 — End: 2015-08-16
  Administered 2015-08-16: 1000 mL via INTRAVENOUS

## 2015-08-16 MED ORDER — IBUPROFEN 400 MG PO TABS
400.0000 mg | ORAL_TABLET | Freq: Three times a day (TID) | ORAL | Status: DC | PRN
Start: 2015-08-16 — End: 2015-08-17

## 2015-08-16 MED ORDER — NALOXONE HCL 0.4 MG/ML IJ SOLN
0.2000 mg | INTRAMUSCULAR | Status: DC | PRN
Start: 2015-08-16 — End: 2015-08-17

## 2015-08-16 MED ORDER — PROMETHAZINE HCL 25 MG/ML IJ SOLN
12.5000 mg | Freq: Once | INTRAMUSCULAR | Status: AC
Start: 2015-08-16 — End: 2015-08-16
  Administered 2015-08-16: 12.5 mg via INTRAVENOUS
  Filled 2015-08-16: qty 1

## 2015-08-16 MED ORDER — FAMOTIDINE 10 MG/ML IV SOLN (WRAP)
20.0000 mg | Freq: Two times a day (BID) | INTRAVENOUS | Status: DC
Start: 2015-08-16 — End: 2015-08-17

## 2015-08-16 MED ORDER — SODIUM CHLORIDE 0.9 % IV SOLN
INTRAVENOUS | Status: DC
Start: 2015-08-16 — End: 2015-08-17

## 2015-08-16 MED ORDER — ONDANSETRON HCL 4 MG/2ML IJ SOLN
4.0000 mg | Freq: Three times a day (TID) | INTRAMUSCULAR | Status: DC | PRN
Start: 2015-08-16 — End: 2015-08-17
  Administered 2015-08-16 – 2015-08-17 (×3): 4 mg via INTRAVENOUS
  Filled 2015-08-16 (×3): qty 2

## 2015-08-16 MED ORDER — ACETAMINOPHEN 325 MG PO TABS
650.0000 mg | ORAL_TABLET | ORAL | Status: DC | PRN
Start: 2015-08-16 — End: 2015-08-17

## 2015-08-16 MED ORDER — ENOXAPARIN SODIUM 40 MG/0.4ML SC SOLN
40.0000 mg | Freq: Every day | SUBCUTANEOUS | Status: DC
Start: 2015-08-16 — End: 2015-08-17
  Administered 2015-08-16: 40 mg via SUBCUTANEOUS
  Filled 2015-08-16 (×2): qty 0.4

## 2015-08-16 MED ORDER — ACETAMINOPHEN 650 MG RE SUPP
650.0000 mg | RECTAL | Status: DC | PRN
Start: 2015-08-16 — End: 2015-08-17

## 2015-08-16 NOTE — H&P (Signed)
[facility]      Patient: Natalie Noble  Date: 08/16/2015   DOB: 1990/12/30  Admission Date: 08/16/2015   MRN: [id number]  Attending: Berenice Bouton         Chief Complaint   Patient presents with   . Abdominal Pain   . Emesis      History Gathered From: Patient    HISTORY AND PHYSICAL     Natalie Noble is a 24 y.o. female with a PMHx of GERD and Marijuana abuse who presented with N/V for the last 3 days. Pt had multiple ED visit and admission for the same reason and last time was in 06/2015. Pt smoke marijuana for long time and she said the last use was in the weekend but Tox screen +ve. Pt also complain for heart burn and pain all over her body.     Past Medical History   Diagnosis Date   . Reflux        History reviewed. No pertinent past surgical history.    Prior to Admission medications    Medication Sig Start Date End Date Taking? Authorizing Provider   DOXYCYCLINE CALCIUM PO Take by mouth.   Yes [provider]   ondansetron (ZOFRAN ODT) 4 MG disintegrating tablet Take 1 tablet (4 mg total) by mouth every 6 (six) hours as needed for Nausea. 05/11/14  Yes Maryella Shivers, MD   Acetaminophen-Codeine (TYLENOL/CODEINE #3) 300-30 MG per tablet Take 1-2 tablets by mouth every 6 (six) hours as needed for Pain (as needed for pain or cough). 05/11/14   Maryella Shivers, MD   esomeprazole (NEXIUM) 20 MG capsule Take 1 capsule (20 mg total) by mouth every morning before breakfast. 02/16/15   Lorenza Cambridge, MD   promethazine (PHENERGAN) 25 MG tablet Take 0.5-1 tablets (12.5-25 mg total) by mouth every 6 (six) hours as needed for Nausea. 02/16/15   Lorenza Cambridge, MD   traMADol (ULTRAM) 50 MG tablet Take 1 tablet (50 mg total) by mouth every 6 (six) hours as needed for Pain. Do not drive or operate machinery while taking this medication 02/16/15   Lorenza Cambridge, MD       Allergies   Allergen Reactions   . Erythromycin        CODE STATUS: full    PRIMARY CARE MD: Pcp, [facility], MD    History reviewed. No pertinent  family history.    Social History   Substance Use Topics   . Smoking status: Current Every Day Smoker   . Smokeless tobacco: None   . Alcohol Use: Yes       REVIEW OF SYSTEMS   Positive for: N/V and weakness  Negative for: fever, chills, blood in the vomit  All review of systems completed.    PHYSICAL EXAM     Vital Signs (most recent): BP 137/50 mmHg  Pulse 66  Temp(Src) 99.5 F (37.5 C) (Oral)  Resp 20  Ht 1.626 m (5\' 4" )  Wt 65.772 kg (145 lb)  BMI 24.88 kg/m2  SpO2 100%  Constitutional: No apparent distress. Patient speaks freely in full sentences.   HEENT: NC/AT, PERRL, no scleral icterus or conjunctival pallor, no nasal discharge, MMM, oropharynx without erythema or exudate  Neck: trachea midline, supple, no cervical or supraclavicular lymphadenopathy or masses  Cardiovascular: RRR, normal S1 S2, no murmurs, gallops, palpable thrills, no JVD, Non-displaced PMI.  Respiratory: Normal rate. No retractions or increased work of breathing. Clear to auscultation and  percussion bilaterally.  Gastrointestinal: +BS, non-distended, soft, non-tender, no rebound or guarding, no hepatosplenomegaly  Genitourinary: no suprapubic or costovertebral angle tenderness  Musculoskeletal: ROM and motor strength grossly normal. No clubbing, edema, or cyanosis. DP and radial pulses 2+ and symmetric.  Skin exam:  Normal not mottled or palor  Neurologic: EOMI, CN 2-12 grossly intact. no gross motor or sensory deficits  Psychiatric: AAOx3, affect and mood appropriate. The patient is alert, interactive, appropriate.  Capillary refill:  Normal    Exam done by Berenice Bouton, MD on 08/16/2015 at 7:53 PM      LABS & IMAGING     Recent Results (from the past 24 hour(s))   CBC with differential    Collection Time: 08/16/15  1:37 PM   Result Value Ref Range    WBC 12.97 (H) 3.50 - 10.80 x10 3/uL    Hgb 15.4 12.0 - 16.0 g/dL    Hematocrit 16.1 09.6 - 47.0 %    Platelets 206 140 - 400 x10 3/uL    RBC 4.85 4.20 - 5.40 x10 6/uL    MCV 92.6  80.0 - 100.0 fL    MCH 31.8 28.0 - 32.0 pg    MCHC 34.3 32.0 - 36.0 g/dL    RDW 12 12 - 15 %    MPV 10.7 9.4 - 12.3 fL    Neutrophils 90 None %    Lymphocytes Automated 7 None %    Monocytes 3 None %    Eosinophils Automated 0 None %    Basophils Automated 0 None %    Immature Granulocyte 0 None %    Nucleated RBC 0 0 - 1 /100 WBC    Neutrophils Absolute 11.73 (H) 1.80 - 8.10 x10 3/uL    Abs Lymph Automated 0.86 0.50 - 4.40 x10 3/uL    Abs Mono Automated 0.37 0.00 - 1.20 x10 3/uL    Abs Eos Automated 0.00 0.00 - 0.70 x10 3/uL    Absolute Baso Automated 0.01 0.00 - 0.20 x10 3/uL    Absolute Immature Granulocyte 0.03 0 x10 3/uL   Comprehensive metabolic panel    Collection Time: 08/16/15  1:37 PM   Result Value Ref Range    Glucose 107 (H) 70 - 100 mg/dL    BUN 04.5 7.0 - 40.9 mg/dL    Creatinine 0.8 0.6 - 1.0 mg/dL    Sodium 811 914 - 782 mEq/L    Potassium 3.9 3.5 - 5.1 mEq/L    Chloride 105 100 - 111 mEq/L    CO2 25 22 - 29 mEq/L    Calcium 10.3 8.5 - 10.5 mg/dL    Protein, Total 7.9 6.0 - 8.3 g/dL    Albumin 4.8 3.5 - 5.0 g/dL    AST (SGOT) 29 5 - 34 U/L    ALT 43 0 - 55 U/L    Alkaline Phosphatase 93 37 - 106 U/L    Bilirubin, Total 0.7 0.2 - 1.2 mg/dL    Globulin 3.1 2.0 - 3.6 g/dL    Albumin/Globulin Ratio 1.5 0.9 - 2.2    Anion Gap 11.0 5.0 - 15.0   Lipase    Collection Time: 08/16/15  1:37 PM   Result Value Ref Range    Lipase 50 8 - 78 U/L   Hemolysis index    Collection Time: 08/16/15  1:37 PM   Result Value Ref Range    Hemolysis Index 10 0 - 18   GFR    Collection Time: 08/16/15  1:37 PM   Result Value Ref Range    EGFR >60.0    UA, Reflex to Microscopic (pts 3 + yrs)    Collection Time: 08/16/15  4:30 PM   Result Value Ref Range    Urine Type Clean Catch     Color, UA Yellow Clear - Yellow    Clarity, UA Clear Clear - Hazy    Specific Gravity UA 1.020 1.001-1.035    Urine pH 7.0 5.0-8.0    Leukocyte Esterase, UA Negative Negative    Nitrite, UA Negative Negative    Protein, UR 30 (A) Negative    Glucose,  UA Negative Negative    Ketones UA 20 (A) Negative    Urobilinogen, UA Negative 0.2  -  2.0 mg/dL    Bilirubin, UA Negative Negative    Blood, UA Small (A) Negative    RBC, UA 6 - 10 (A) 0 - 5 /hpf    WBC, UA 0 - 5 0 - 5 /hpf    Squamous Epithelial Cells, Urine 0 - 5 0 - 25 /hpf   Urine HCG, Qualitative    Collection Time: 08/16/15  4:30 PM   Result Value Ref Range    Urine HCG Qualitative Negative Negative   Urine Tox Screen (Rapid Drug Screen)    Collection Time: 08/16/15  4:30 PM   Result Value Ref Range    Amphetamine Screen, UR Negative Negative    Barbiturate Screen, UR Negative Negative    Benzodiazepine Screen, UR Negative Negative    Cannabinoid Screen, UR Positive (A) Negative    Cocaine, UR Negative Negative    Opiate Screen, UR Negative Negative    PCP Screen, UR Negative Negative       MICROBIOLOGY:  Blood Culture:NO  Urine Culture: No  Antibiotics Started: No    IMAGING:  Upon my review: No results found.      CARDIAC:  EKG Interpretation (upon my review):  No results found.      Markers:        EMERGENCY DEPARTMENT COURSE:  Orders Placed This Encounter   Procedures   . CBC with differential   . Comprehensive metabolic panel   . Lipase   . UA, Reflex to Microscopic (pts 3 + yrs)   . Urine HCG, Qualitative   . Hemolysis index   . GFR   . CBC with differential   . Comprehensive metabolic panel   . Urine Tox Screen (Rapid Drug Screen)   . Lipase   . Hemolysis index   . GFR   . Troponin I   . ED [facility] Communication Order   . ECG 12 lead   . Saline lock IV   . Saline lock IV   . Place (admit) for Observation Services   . Place (admit) for Observation Services   . [facility] ED Bed Request   . [facility] ED Bed Request       ASSESSMENT & PLAN     Natalie Noble is a 24 y.o. female admitted under [facility] with N and V.    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  - Pt had multiple ED visit and admission for the same reason  and she had multiple test which was NL  - Will Start IVF  - Start Zofran  - H2B  - Council about Marijuana abuse  - Will try to avoid Opioid.    Marijuana and Nicotine abuse  -  Urine tox +ve  - Council about drug abuse          Nutrition  NPO    DVT/VTE Prophylaxis  Lovenox    Anticipated medical stability for discharge: 24h    Service status/Reason for ongoing hospitalization: N/V   Anticipated Discharge Needs: Tolerate PO     Signed,  Blayre Papania K Abisola Carrero    08/16/2015 7:53 PM  Time Elapsed: 50 min

## 2015-08-16 NOTE — ED Provider Notes (Signed)
EMERGENCY DEPARTMENT HISTORY AND PHYSICAL EXAM     Physician/Midlevel provider first contact with patient: 08/16/15 1311         Date: 08/16/2015  Patient Name: Natalie Noble    History of Presenting Illness     Chief Complaint   Patient presents with   . Abdominal Pain   . Emesis       History Provided By: Patient and Patient's Mother    Chief Complaint: Vomiting  Onset: Two days  Timing: Constant  Severity: Moderate  Exacerbating factors: None  Alleviating factors: Zofran with no significant relief  Associated Symptoms: Nausea, abdominal pain, constipation, dizziness, SOB, R sided CP, hot and cold flashes, and diaphoresis  Pertinent Negatives: No dysuria    Additional History: Natalie Noble is a 24 y.o. female with a history of PCOS presenting to the ED with constant vomiting which began two days ago. Pt's mother explains that the pt has visited the ED for similar symptoms before, the last time being in 05/2015. She recently had a gastric emptying study which they have not received the results for yet. Otherwise, the pt has been seen for similar symptoms multiple times before and all of her scans and tests have come back normal.She has also had an upper endoscopy which mother says was normal.  Pt also reports nausea, epigastric abdominal pain, dizziness, SOB, R sided CP, episodes of hot and cold flashes, and diaphoresis. She denies dysuria. Pt is on Depo birth control and does not get her periods. Her last depo was about 6 months ago. She also takes Doxycycline for acne and recently took Bactrim for a staph infection which has resolved.    PCP: Pcp, [facility], MD    No current facility-administered medications for this encounter.     Current Outpatient Prescriptions   Medication Sig Dispense Refill   . ondansetron (ZOFRAN ODT) 4 MG disintegrating tablet Take 1 tablet (4 mg total) by mouth every 6 (six) hours as needed for Nausea. 8 tablet 0   . esomeprazole (NEXIUM) 20 MG capsule Take 1 capsule (20 mg total)  by mouth every morning before breakfast. 30 capsule 0   . promethazine (PHENERGAN) 25 MG tablet Take 0.5-1 tablets (12.5-25 mg total) by mouth every 6 (six) hours as needed for Nausea. 12 tablet 0       Past History     Past Medical History:  Past Medical History   Diagnosis Date   . Reflux        Past Surgical History:  History reviewed. No pertinent past surgical history.    Family History:  History reviewed. No pertinent family history.    Social History:  Social History   Substance Use Topics   . Smoking status: Current Every Day Smoker   . Smokeless tobacco: None   . Alcohol Use: Yes       Allergies:  Allergies   Allergen Reactions   . Erythromycin        Review of Systems     Review of Systems   Constitutional: Positive for diaphoresis.        +"hot and cold flashes"   HENT: Negative for nosebleeds.    Eyes: Negative for discharge.   Respiratory: Positive for shortness of breath.    Cardiovascular: Positive for chest pain.   Gastrointestinal: Positive for nausea, vomiting, abdominal pain and constipation. Abdominal distention: epigastric.   Genitourinary: Negative for dysuria.   Skin: Negative for wound.   Neurological: Positive for dizziness.  Psychiatric/Behavioral: Negative for suicidal ideas.          Physical Exam   BP 123/70 mmHg  Pulse 69  Temp(Src) 98.7 F (37.1 C) (Oral)  Resp 18  Ht 5\' 4"  (1.626 m)  Wt 61.961 kg  BMI 23.44 kg/m2  SpO2 100%  Physical Exam   Constitutional: She is oriented to person, place, and time. She appears well-developed and well-nourished. She appears distressed.   HENT:   Head: Normocephalic and atraumatic.   Eyes: Right eye exhibits no discharge. Left eye exhibits no discharge. No scleral icterus.   Neck: Normal range of motion. Neck supple.   Cardiovascular: Normal rate, regular rhythm and normal heart sounds.    Pulmonary/Chest: Effort normal and breath sounds normal. No respiratory distress. She has no wheezes. She has no rales.   Abdominal: Soft. Bowel sounds are  normal. She exhibits no distension. There is tenderness (epigastric). There is no rebound and no guarding.   Musculoskeletal: She exhibits no edema or tenderness.   Neurological: She is alert and oriented to person, place, and time. She exhibits normal muscle tone.   Skin: Skin is warm and dry. She is not diaphoretic.   Psychiatric: Her speech is normal and behavior is normal. Judgment and thought content normal. Her mood appears anxious. Cognition and memory are normal.   Nursing note and vitals reviewed.        Diagnostic Study Results     Labs -     Results     Procedure Component Value Units Date/Time    Troponin I [[id number]] Collected:  08/17/15 0406     Troponin I <0.01 ng/mL Updated:  08/17/15 0512    Comprehensive metabolic panel [[id number]]  (Abnormal) Collected:  08/17/15 0406    Specimen Information:  Blood Updated:  08/17/15 0508     Glucose 83 mg/dL      BUN 78.2 mg/dL      Creatinine 0.7 mg/dL      Sodium 956 mEq/L      Potassium 3.5 mEq/L      Chloride 113 (H) mEq/L      CO2 21 (L) mEq/L      Calcium 8.9 mg/dL      Protein, Total 5.6 (L) g/dL      Albumin 3.2 (L) g/dL      AST (SGOT) 18 U/L      ALT 23 U/L      Alkaline Phosphatase 67 U/L      Bilirubin, Total 0.6 mg/dL      Globulin 2.4 g/dL      Albumin/Globulin Ratio 1.3      Anion Gap 7.0     Lipase [[id number]] Collected:  08/17/15 0406    Specimen Information:  Blood Updated:  08/17/15 0508     Lipase 50 U/L     Hemolysis index [[id number]] Collected:  08/17/15 0406     Hemolysis Index 11 Updated:  08/17/15 0508    GFR [[id number]] Collected:  08/17/15 0406     EGFR >60.0 Updated:  08/17/15 0508    CBC with differential [[id number]]  (Abnormal) Collected:  08/17/15 0406    Specimen Information:  Blood from Blood Updated:  08/17/15 0455     WBC 10.88 (H) x10 3/uL      Hgb 11.9 (L) g/dL      Hematocrit 03.4 (L) %      Platelets 157 x10 3/uL      RBC 3.88 (L) x10 6/uL      MCV  92.5 fL      MCH 30.7 pg      MCHC 33.1 g/dL      RDW 12 %      MPV 10.8 fL       Neutrophils 60 %      Lymphocytes Automated 31 %      Monocytes 9 %      Eosinophils Automated 0 %      Basophils Automated 0 %      Immature Granulocyte 0 %      Nucleated RBC 1 /100 WBC      Neutrophils Absolute 6.50 x10 3/uL      Abs Lymph Automated 3.34 x10 3/uL      Abs Mono Automated 1.00 x10 3/uL      Abs Eos Automated 0.02 x10 3/uL      Absolute Baso Automated 0.02 x10 3/uL      Absolute Immature Granulocyte 0.02 x10 3/uL     Basic Metabolic Panel [[id number]] Collected:  08/17/15 0406    Specimen Information:  Blood Updated:  08/17/15 0406          Radiologic Studies -   Radiology Results (24 Hour)     ** No results found for the last 24 hours. **      .    Medical Decision Making   I am the first provider for this patient.    I reviewed the vital signs, available nursing notes, past medical history, past surgical history, family history and social history.    Vital Signs-Reviewed the patient's vital signs.     No data found.      Pulse Oximetry Analysis - Normal 100% on RA    Cardiac Monitor:  Rate: 82  Rhythm: Sinus arrhythmia    EKG:  Interpreted by the EP.   Time Interpreted: 1354   Rate: 82   Rhythm: Sinus arrhythmia    Interpretation: No acute changes   Comparison: Not compared    Old Medical Records: Old medical records.     ED Course:     3:41 PM - Pt still reports epigastric pain.    5:03 PM - Pt's cannabinoid screen resulted as positive.    5:10 PM - When asked about marijuana use, the pt says that her vomiting began prior to her using marijuana. The last time she used was three days ago. She's had a sonrgam for gallbladder and says that her results were fine. The pt is currently spitting up phlegm, but not vomiting. Will give water.    5:49 PM - Pt did not drink the water and has been dry heaving.    6:19 PM - Discussed pt case with Dr. Alona Bene, [place], who accepts the pt and advises not to scan her, will observe.    7:14 PM - Pt is ambulating in the ED.    Provider Notes:       Diagnosis      Clinical Impression:   1. Intractable cyclical vomiting with nausea    2. Marijuana abuse    Treatment Plan:   ED Disposition     Admit Bed Type: General [8]  Admitting Physician: Berenice Bouton [[id number]]  Patient Class: Observation [104]              _______________________________      Attestations: This note is prepared by Adela Glimpse, acting as scribe for Azzie Glatter, MD. The scribe's documentation has been prepared under my direction and personally reviewed by me  in its entirety.  I confirm that the note above accurately reflects all work, treatment, procedures, and medical decision making performed by me.    _______________________________    Azzie Glatter, MD  08/19/15 [phone]

## 2015-08-16 NOTE — ED Notes (Addendum)
Vomiting, diffuse abd pain x2 days.  Denies dysuria or diarrhea. Unrelieved by zofran.

## 2015-08-17 LAB — CBC AND DIFFERENTIAL
Basophils Absolute Automated: 0.02 10*3/uL (ref 0.00–0.20)
Basophils Automated: 0 %
Eosinophils Absolute Automated: 0.02 10*3/uL (ref 0.00–0.70)
Eosinophils Automated: 0 %
Hematocrit: 35.9 % — ABNORMAL LOW (ref 37.0–47.0)
Hgb: 11.9 g/dL — ABNORMAL LOW (ref 12.0–16.0)
Immature Granulocytes Absolute: 0.02 10*3/uL
Immature Granulocytes: 0 %
Lymphocytes Absolute Automated: 3.34 10*3/uL (ref 0.50–4.40)
Lymphocytes Automated: 31 %
MCH: 30.7 pg (ref 28.0–32.0)
MCHC: 33.1 g/dL (ref 32.0–36.0)
MCV: 92.5 fL (ref 80.0–100.0)
MPV: 10.8 fL (ref 9.4–12.3)
Monocytes Absolute Automated: 1 10*3/uL (ref 0.00–1.20)
Monocytes: 9 %
Neutrophils Absolute: 6.5 10*3/uL (ref 1.80–8.10)
Neutrophils: 60 %
Nucleated RBC: 1 /100 WBC (ref 0–1)
Platelets: 157 10*3/uL (ref 140–400)
RBC: 3.88 10*6/uL — ABNORMAL LOW (ref 4.20–5.40)
RDW: 12 % (ref 12–15)
WBC: 10.88 10*3/uL — ABNORMAL HIGH (ref 3.50–10.80)

## 2015-08-17 LAB — COMPREHENSIVE METABOLIC PANEL
ALT: 23 U/L (ref 0–55)
AST (SGOT): 18 U/L (ref 5–34)
Albumin/Globulin Ratio: 1.3 (ref 0.9–2.2)
Albumin: 3.2 g/dL — ABNORMAL LOW (ref 3.5–5.0)
Alkaline Phosphatase: 67 U/L (ref 37–106)
Anion Gap: 7 (ref 5.0–15.0)
BUN: 11 mg/dL (ref 7.0–19.0)
Bilirubin, Total: 0.6 mg/dL (ref 0.2–1.2)
CO2: 21 mEq/L — ABNORMAL LOW (ref 22–29)
Calcium: 8.9 mg/dL (ref 8.5–10.5)
Chloride: 113 mEq/L — ABNORMAL HIGH (ref 100–111)
Creatinine: 0.7 mg/dL (ref 0.6–1.0)
Globulin: 2.4 g/dL (ref 2.0–3.6)
Glucose: 83 mg/dL (ref 70–100)
Potassium: 3.5 mEq/L (ref 3.5–5.1)
Protein, Total: 5.6 g/dL — ABNORMAL LOW (ref 6.0–8.3)
Sodium: 141 mEq/L (ref 136–145)

## 2015-08-17 LAB — TROPONIN I: Troponin I: <0.01 ng/mL (ref 0.00–0.09)

## 2015-08-17 LAB — LIPASE: Lipase: 50 U/L (ref 8–78)

## 2015-08-17 LAB — GFR: EGFR: >60

## 2015-08-17 LAB — HEMOLYSIS INDEX: Hemolysis Index: 11 (ref 0–18)

## 2015-08-17 MED ORDER — ONDANSETRON 4 MG PO TBDP
4.0000 mg | ORAL_TABLET | Freq: Three times a day (TID) | ORAL | Status: DC | PRN
Start: 2015-08-17 — End: 2015-08-17

## 2015-08-17 NOTE — Progress Notes (Signed)
The patient and or patient's representative were informed both verbally and in writing that they are placed in Observation/Outpatient status.  A copy of the written notification was placed in the shadow chart.  Donnette Macmullen Ext [phone]

## 2015-08-17 NOTE — Plan of Care (Signed)
Problem: Symptom Management  Goal: Adequate hydration  Patient come with diagnoses of nausea and vomiting. after she came to the floor she complain of nausea no vomiting noted, Zofran 4 mg iv times one given. Her vital sign within normal limites. She is npo, iv fluid running at 100 ml/hr.stady gate go to bath room independently.   Plan-: continue monitoring intake and out put.

## 2015-08-17 NOTE — Final Progress Note (DC Note for stay less than 48 (Signed)
Date:08/17/2015   Patient Name: Natalie Noble T  Attending Physician: Berenice Bouton, MD  Today:   BP 122/63 mmHg  Pulse 82  Temp(Src) 98 F (36.7 C) (Oral)  Resp 18  Ht 1.626 m (5\' 4" )  Wt 61.961 kg (136 lb 9.6 oz)  BMI 23.44 kg/m2  SpO2 99%  Ranges for the last 24 hours:  Temp:  [98 F (36.7 C)-99.5 F (37.5 C)] 98 F (36.7 C)  Heart Rate:  [66-83] 82  Resp Rate:  [14-20] 18  BP: (122-137)/(50-98) 122/63 mmHg    Date of Admission:   08/16/2015    Date of Discharge:   08/16/2017  Outcome of Hospitalization:   Active Problems:    Intractable cyclical vomiting with nausea  Resolved Problems:    * No resolved hospital problems. *     Significant Events: Nausea, vomiting, improvement.  Patient tolerated regular diet requested to be discharged home    Lab Results last 48 Hours     Procedure Component Value Units Date/Time    Troponin I [[id number]] Collected:  08/17/15 0406     Troponin I <0.01 ng/mL Updated:  08/17/15 0512    Comprehensive metabolic panel [[id number]]  (Abnormal) Collected:  08/17/15 0406    Specimen Information:  Blood Updated:  08/17/15 0508     Glucose 83 mg/dL      BUN 78.2 mg/dL      Creatinine 0.7 mg/dL      Sodium 956 mEq/L      Potassium 3.5 mEq/L      Chloride 113 (H) mEq/L      CO2 21 (L) mEq/L      Calcium 8.9 mg/dL      Protein, Total 5.6 (L) g/dL      Albumin 3.2 (L) g/dL      AST (SGOT) 18 U/L      ALT 23 U/L      Alkaline Phosphatase 67 U/L      Bilirubin, Total 0.6 mg/dL      Globulin 2.4 g/dL      Albumin/Globulin Ratio 1.3      Anion Gap 7.0     Lipase [[id number]] Collected:  08/17/15 0406    Specimen Information:  Blood Updated:  08/17/15 0508     Lipase 50 U/L     Hemolysis index [[id number]] Collected:  08/17/15 0406     Hemolysis Index 11 Updated:  08/17/15 0508    GFR [[id number]] Collected:  08/17/15 0406     EGFR >60.0 Updated:  08/17/15 0508    CBC with differential [[id number]]  (Abnormal) Collected:  08/17/15 0406    Specimen Information:  Blood from Blood Updated:   08/17/15 0455     WBC 10.88 (H) x10 3/uL      Hgb 11.9 (L) g/dL      Hematocrit 03.4 (L) %      Platelets 157 x10 3/uL      RBC 3.88 (L) x10 6/uL      MCV 92.5 fL      MCH 30.7 pg      MCHC 33.1 g/dL      RDW 12 %      MPV 10.8 fL      Neutrophils 60 %      Lymphocytes Automated 31 %      Monocytes 9 %      Eosinophils Automated 0 %      Basophils Automated 0 %      Immature Granulocyte 0 %  Nucleated RBC 1 /100 WBC      Neutrophils Absolute 6.50 x10 3/uL      Abs Lymph Automated 3.34 x10 3/uL      Abs Mono Automated 1.00 x10 3/uL      Abs Eos Automated 0.02 x10 3/uL      Absolute Baso Automated 0.02 x10 3/uL      Absolute Immature Granulocyte 0.02 x10 3/uL     CBC with differential [[id number]] Collected:  08/17/15 0406    Specimen Information:  Blood from Blood Updated:  08/17/15 0425    Basic Metabolic Panel [[id number]] Collected:  08/17/15 0406    Specimen Information:  Blood Updated:  08/17/15 0406    Urine Tox Screen (Rapid Drug Screen) [[id number]]  (Abnormal) Collected:  08/16/15 1630    Specimen Information:  Urine Updated:  08/16/15 1654     Amphetamine Screen, UR Negative      Barbiturate Screen, UR Negative      Benzodiazepine Screen, UR Negative      Cannabinoid Screen, UR Positive (A)      Cocaine, UR Negative      Opiate Screen, UR Negative      PCP Screen, UR Negative     Urine HCG, Qualitative [[id number]] Collected:  08/16/15 1630    Specimen Information:  Urine Updated:  08/16/15 1650     Urine HCG Qualitative Negative     UA, Reflex to Microscopic (pts 3 + yrs) [[id number]]  (Abnormal) Collected:  08/16/15 1630    Specimen Information:  Urine Updated:  08/16/15 1649     Urine Type Clean Catch      Color, UA Yellow      Clarity, UA Clear      Specific Gravity UA 1.020      Urine pH 7.0      Leukocyte Esterase, UA Negative      Nitrite, UA Negative      Protein, UR 30 (A)      Glucose, UA Negative      Ketones UA 20 (A)      Urobilinogen, UA Negative mg/dL      Bilirubin, UA Negative      Blood, UA  Small (A)      RBC, UA 6 - 10 (A) /hpf      WBC, UA 0 - 5 /hpf      Squamous Epithelial Cells, Urine 0 - 5 /hpf     Comprehensive metabolic panel [[id number]]  (Abnormal) Collected:  08/16/15 1337    Specimen Information:  Blood Updated:  08/16/15 1411     Glucose 107 (H) mg/dL      BUN 73.2 mg/dL      Creatinine 0.8 mg/dL      Sodium 202 mEq/L      Potassium 3.9 mEq/L      Chloride 105 mEq/L      CO2 25 mEq/L      Calcium 10.3 mg/dL      Protein, Total 7.9 g/dL      Albumin 4.8 g/dL      AST (SGOT) 29 U/L      ALT 43 U/L      Alkaline Phosphatase 93 U/L      Bilirubin, Total 0.7 mg/dL      Globulin 3.1 g/dL      Albumin/Globulin Ratio 1.5      Anion Gap 11.0     Lipase [[id number]] Collected:  08/16/15 1337    Specimen Information:  Blood Updated:  08/16/15 1411  Lipase 50 U/L     Hemolysis index [[id number]] Collected:  08/16/15 1337     Hemolysis Index 10 Updated:  08/16/15 1411    GFR [[id number]] Collected:  08/16/15 1337     EGFR >60.0 Updated:  08/16/15 1411    CBC with differential [[id number]]  (Abnormal) Collected:  08/16/15 1337    Specimen Information:  Blood from Blood Updated:  08/16/15 1353     WBC 12.97 (H) x10 3/uL      Hgb 15.4 g/dL      Hematocrit 27.2 %      Platelets 206 x10 3/uL      RBC 4.85 x10 6/uL      MCV 92.6 fL      MCH 31.8 pg      MCHC 34.3 g/dL      RDW 12 %      MPV 10.7 fL      Neutrophils 90 %      Lymphocytes Automated 7 %      Monocytes 3 %      Eosinophils Automated 0 %      Basophils Automated 0 %      Immature Granulocyte 0 %      Nucleated RBC 0 /100 WBC      Neutrophils Absolute 11.73 (H) x10 3/uL      Abs Lymph Automated 0.86 x10 3/uL      Abs Mono Automated 0.37 x10 3/uL      Abs Eos Automated 0.00 x10 3/uL      Absolute Baso Automated 0.01 x10 3/uL      Absolute Immature Granulocyte 0.03 x10 3/uL           Procedures performed:   Radiology: all results from this admission  No results found.  Radiology: all results in the last 7 days  No results found.  Radiology: all  results in the last 24 hours  No results found.  Surgery: all results from this admission  * No surgery found *    Treatment Team:   Attending Provider: Berenice Bouton, MD  Consulting Physician: Solon Palm, MD    Disposition:   Disposition: Home or Self Care    Condition at Discharge:   good     Follow up Recommendations for Receiving Provider     1. Follow out his primary care doctor  2. Considered to stop using recreational drugs  Unresulted Labs     Procedure . . . Date/Time    CBC with differential [[id number]] Collected:  08/17/15 0406    Specimen Information:  Blood from Blood Updated:  08/17/15 0425    Basic Metabolic Panel [[id number]] Collected:  08/17/15 0406    Specimen Information:  Blood Updated:  08/17/15 0406          Discharge Instructions:     Follow-up Information     Follow up with Pcp, [facility], MD .        Peripheral IV 08/16/15 Left Antecubital (Active)   Site Assessment Clean;Dry;Intact 08/17/2015  8:15 AM   Line Status Infusing 08/17/2015  8:15 AM   Dressing Status Clean;Dry;Intact 08/17/2015  8:15 AM   Dressing Change Due 08/23/15 08/17/2015  8:15 AM   Reason Not Rotated Not due 08/16/2015  9:23 PM   Number of days:1          There is no immunization history on file for this patient.  Extended Emergency Contact Information  Primary Emergency Contact: [place]  Home Phone: [phone]  Mobile Phone: [phone]  Relation: Mother  Full Code  Activity/Weight bearing status: As tolerated     Discharge Medication List      Taking          esomeprazole 20 MG capsule   Dose:  20 mg   Commonly known as:  NexIUM   Take 1 capsule (20 mg total) by mouth every morning before breakfast.       * ondansetron 4 MG disintegrating tablet   Dose:  4 mg   What changed:  Another medication with the same name was added. Make sure you understand how and when to take each.   Commonly known as:  ZOFRAN ODT   Take 1 tablet (4 mg total) by mouth every 6 (six) hours as needed for  Nausea.       * ondansetron 4 MG disintegrating tablet   Dose:  4 mg   What changed:  You were already taking a medication with the same name, and this prescription was added. Make sure you understand how and when to take each.   Commonly known as:  ZOFRAN-ODT   Take 1 tablet (4 mg total) by mouth every 8 (eight) hours as needed.       promethazine 25 MG tablet   Dose:  12.5-25 mg   Commonly known as:  PHENERGAN   Take 0.5-1 tablets (12.5-25 mg total) by mouth every 6 (six) hours as needed for Nausea.       * Notice:  This list has 2 medication(s) that are the same as other medications prescribed for you. Read the directions carefully, and ask your doctor or other care provider to review them with you.      STOP taking these medications          Acetaminophen-Codeine 300-30 MG per tablet   Commonly known as:  TYLENOL/CODEINE #3       DOXYCYCLINE CALCIUM PO       traMADol 50 MG tablet   Commonly known as:  ULTRAM             Signed by: Solon Palm, MD

## 2015-08-17 NOTE — Progress Notes (Signed)
Patient discharged home. No special discharge needs. No prescriptions for this patient. Discharge instructions given to patient and family and reviewed. No questions at this time. Patient to follow up with PCP. Patient declined wheelchair transport and left unit independently on foot accompanied by family

## 2015-08-17 NOTE — UM Notes (Signed)
Place (admit) for Observation Services Once     08/16/15 1823     24 y.o. female with a PMHx of GERD and Marijuana abuse who presented with N/V for the last 3 days. Pt had multiple ED visit and admission for the same reason and last time was in 06/2015. Pt smoke marijuana for long time and she said the last use was in the weekend but Tox screen +ve. Pt also complain for heart burn and pain all over her body.     Vital Signs (most recent): BP 137/50 mmHg  Pulse 66  Temp(Src) 99.5 F (37.5 C) (Oral)  Resp 20  Ht 1.626 m (5\' 4" )  Wt 65.772 kg (145 lb)  BMI 24.88 kg/m2  SpO2 100%    WBC 3.50 - 10.80 x10 3/uL 12.97 (H)           Protein, UR Negative  30 (A)    Glucose, UA Negative  Negative    Ketones UA Negative  20 (A)    Urobilinogen, UA 0.2 - 2.0 mg/dL Negative    Bilirubin, UA Negative  Negative    Blood, UA Negative  Small (A)    RBC, UA 0 - 5 /hpf 6 - 10 (A)            Chloride 100 - 111 mEq/L 113 (H) 105 104 102    CO2 22 - 29 mEq/L 21 (L) 25 25 24     Calcium 8.5 - 10.5 mg/dL 8.9 16.1 09.6 04.5 (H)    Protein, Total 6.0 - 8.3 g/dL 5.6 (L) 7.9 7.5 8.5 (H)    Albumin 3.5 - 5.0 g/dL 3.2 (L)               Cannabinoid Screen, UR Negative  Positive (A)           ER MEDS:   08/16/2015 1335 sodium chloride 0.9 % bolus 1,000 mL 1,000 mL Intravenous [address] Elpidio Anis, RN        08/16/2015 1530 sodium chloride 0.9 % bolus 1,000 mL 0 mL Intravenous Stopped Gianan, Ana Rogelia Boga, RN completed.      08/16/2015 1437 sodium chloride 0.9 % bolus 1,000 mL 1,000 mL Intravenous [address] Elpidio Anis, RN       08/16/2015 1345 promethazine (PHENERGAN) injection 12.5 mg 12.5 mg Intravenous Given Elpidio Anis, RN       08/16/2015 1925 sodium chloride 0.9 % bolus 1,000 mL 0 mL Intravenous Stopped Gianan, Irena Reichmann, RN completed.      08/16/2015 1539 sodium chloride 0.9 % bolus 1,000 mL 1,000 mL Intravenous [address] [place], [place]       08/16/2015 1609 famotidine (PEPCID) injection 20  mg 20 mg Intravenous Given          MEDS:  enoxaparin (LOVENOX) syringe 40 mg  Dose: 40 mg Freq: Daily Route: SC  0.9% NaCl infusion  Rate: 100 mL/hr Freq: Continuous Route: IV  ondansetron (ZOFRAN) injection 4 mg  Dose: 4 mg Freq: Every 8 hours PRN Route: IV-X2    10/8-CLEAR LIQ DIET. PT C/O OF NAUSEA. NO VOMITING.    [place], UR CM, ACM, CCM. Phone [phone]

## 2015-08-17 NOTE — Plan of Care (Signed)
Problem: Safety  Goal: Patient will be free from injury during hospitalization  Outcome: Progressing  Safe environment provided. Hourly rounding discussed and done. Call bell, table and phone within reach.     Fall prevention plan discussed and interventions in place.         Problem: Pain  Goal: Patient's pain/discomfort is manageable  Outcome: Progressing  Patient denies pain throughout shift. Will continue to assess for pain and provide meds as needed as ordered.        Comments:   Patient remains nauseated throughout shift. IV zofran given when available with good relief. Patient taking CLD.     Ambulates independently.     Will continue to assess and notify MD for changes outside normal limits.

## 2015-08-18 LAB — ECG 12-LEAD
Atrial Rate: 82 {beats}/min
P Axis: 78 degrees
P-R Interval: 130 ms
Q-T Interval: 406 ms
QRS Duration: 70 ms
QTC Calculation (Bezet): 474 ms
R Axis: 67 degrees
T Axis: 21 degrees
Ventricular Rate: 82 {beats}/min

## 2016-09-22 ENCOUNTER — Encounter: Payer: Self-pay | Admitting: Obstetrics and Gynecology

## 2016-09-22 ENCOUNTER — Ambulatory Visit (INDEPENDENT_AMBULATORY_CARE_PROVIDER_SITE_OTHER): Payer: BLUE CROSS/BLUE SHIELD | Admitting: Obstetrics and Gynecology

## 2016-09-22 VITALS — BP 102/60 | HR 80 | Resp 13 | Ht 64.5 in | Wt 158.0 lb

## 2016-09-22 DIAGNOSIS — N915 Oligomenorrhea, unspecified: Secondary | ICD-10-CM | POA: Diagnosis not present

## 2016-09-22 DIAGNOSIS — O926 Galactorrhea: Secondary | ICD-10-CM | POA: Diagnosis not present

## 2016-09-22 DIAGNOSIS — Z0000 Encounter for general adult medical examination without abnormal findings: Secondary | ICD-10-CM | POA: Diagnosis not present

## 2016-09-22 DIAGNOSIS — Z01419 Encounter for gynecological examination (general) (routine) without abnormal findings: Secondary | ICD-10-CM

## 2016-09-22 DIAGNOSIS — Z113 Encounter for screening for infections with a predominantly sexual mode of transmission: Secondary | ICD-10-CM | POA: Diagnosis not present

## 2016-09-22 DIAGNOSIS — Z8639 Personal history of other endocrine, nutritional and metabolic disease: Secondary | ICD-10-CM

## 2016-09-22 DIAGNOSIS — N643 Galactorrhea not associated with childbirth: Secondary | ICD-10-CM

## 2016-09-22 DIAGNOSIS — Z124 Encounter for screening for malignant neoplasm of cervix: Secondary | ICD-10-CM | POA: Diagnosis not present

## 2016-09-22 DIAGNOSIS — N921 Excessive and frequent menstruation with irregular cycle: Secondary | ICD-10-CM | POA: Diagnosis not present

## 2016-09-22 LAB — COMPREHENSIVE METABOLIC PANEL
ALT: 23 U/L (ref 6–29)
AST: 19 U/L (ref 10–30)
Albumin: 4.8 g/dL (ref 3.6–5.1)
Alkaline Phosphatase: 76 U/L (ref 33–115)
BUN: 7 mg/dL (ref 7–25)
CO2: 25 mmol/L (ref 20–31)
Calcium: 9.8 mg/dL (ref 8.6–10.2)
Chloride: 105 mmol/L (ref 98–110)
Creat: 0.75 mg/dL (ref 0.50–1.10)
Glucose, Bld: 78 mg/dL (ref 65–99)
Potassium: 4.1 mmol/L (ref 3.5–5.3)
Sodium: 138 mmol/L (ref 135–146)
Total Bilirubin: 0.4 mg/dL (ref 0.2–1.2)
Total Protein: 7.5 g/dL (ref 6.1–8.1)

## 2016-09-22 LAB — CBC
HCT: 42.2 % (ref 35.0–45.0)
Hemoglobin: 14.1 g/dL (ref 11.7–15.5)
MCH: 30.5 pg (ref 27.0–33.0)
MCHC: 33.4 g/dL (ref 32.0–36.0)
MCV: 91.3 fL (ref 80.0–100.0)
MPV: 10.8 fL (ref 7.5–12.5)
Platelets: 238 10*3/uL (ref 140–400)
RBC: 4.62 MIL/uL (ref 3.80–5.10)
RDW: 12.4 % (ref 11.0–15.0)
WBC: 7.7 10*3/uL (ref 3.8–10.8)

## 2016-09-22 LAB — LIPID PANEL
Cholesterol: 161 mg/dL (ref <200)
HDL: 58 mg/dL (ref >50)
LDL Cholesterol: 86 mg/dL (ref <100)
Total CHOL/HDL Ratio: 2.8 Ratio (ref <5.0)
Triglycerides: 84 mg/dL (ref <150)
VLDL: 17 mg/dL (ref <30)

## 2016-09-22 LAB — TSH: TSH: 0.84 mIU/L

## 2016-09-22 LAB — FERRITIN: Ferritin: 30 ng/mL (ref 10–154)

## 2016-09-22 NOTE — Progress Notes (Addendum)
25 y.o. G0P0000 SingleAfrican AmericanF here for an annual exam. She c/o breast lump on the right side. She noticed it about a month ago. It's tender, hasn't changed in size, no erythema.  Cycles are very irregular. She can bleed every few weeks, or not for 7-8 months at a time. Bleeds x 10 days to 6 weeks at a time. At the worst she can saturate a pad every 30 minutes. Today she is saturating a pad every few hours. She reports she has had a normal ultrasound. She can't tolerate OCP's or the nuvaring. She was on depo-provera in the past for 3 years, did okay on it. Gained weight, but otherwise did fine on it.  She does leak clear to cloudy d/c from her nipples, long term. No c/o hirsutism. She does have acne and gets boils under her arms and sometimes in the pubic area.  Sexually active, same partner for 8 months. Mostly using condoms.  Period Duration (Days): 10 days  Period Pattern: (!) Irregular Menstrual Flow: Moderate Menstrual Control: Maxi pad Dysmenorrhea: (!) Severe Dysmenorrhea Symptoms: Cramping, Headache  Patient's last menstrual period was 09/20/2016.          Sexually active: Yes.    The current method of family planning is condoms most of the time.    Exercising: Yes.    walking Smoker:  yes  Health Maintenance: Pap:  05/2015 abnormal per patient- had colposcopy done, high grade dysplasia.  History of abnormal Pap:  yes TDaP:  Unsure, thinks UTD Gardasil: completed all 3    reports that she has been smoking Cigarettes.  She has been smoking about 0.25 packs per day. She has never used smokeless tobacco. She reports that she drinks about 2.4 oz of alcohol per week . She reports that she uses drugs, including Marijuana.Working 2 full time jobs  Past Medical History:  Diagnosis Date  . Depression   . Hormone disorder   . PCOS (polycystic ovarian syndrome)   . Vitamin D deficiency     Past Surgical History:  Procedure Laterality Date  . COLPOSCOPY     She is on an  antidepressant, not sure which one, will call with it. No current outpatient prescriptions on file.   No current facility-administered medications for this visit.     Family History  Problem Relation Age of Onset  . Fibroids Mother     Review of Systems  Constitutional: Negative.   HENT: Negative.   Eyes: Negative.   Respiratory: Negative.   Cardiovascular: Negative.   Gastrointestinal: Negative.   Endocrine: Negative.   Genitourinary: Negative.        Lump right breast   Musculoskeletal: Negative.   Skin: Negative.   Allergic/Immunologic: Negative.   Neurological: Negative.   Psychiatric/Behavioral: Negative.     Exam:   BP 102/60 (BP Location: Right Arm, Patient Position: Sitting, Cuff Size: Normal)   Pulse 80   Resp 13   Ht 5' 4.5" (1.638 m)   Wt 158 lb (71.7 kg)   LMP 09/20/2016   BMI 26.70 kg/m   Weight change: @WEIGHTCHANGE @ Height:   Height: 5' 4.5" (163.8 cm)  Ht Readings from Last 3 Encounters:  09/22/16 5' 4.5" (1.638 m)    General appearance: alert, cooperative and appears stated age Head: Normocephalic, without obvious abnormality, atraumatic Neck: no adenopathy, supple, symmetrical, trachea midline and thyroid normal to inspection and palpation Lungs: clear to auscultation bilaterally Breasts: 1 cm oval, smooth oval lump at 6 o'clock in the right breast  at the periphery.  Able to express d/c from both nipples, milky on the right and clear on the left.  Heart: regular rate and rhythm Abdomen: soft, non-tender; bowel sounds normal; no masses,  no organomegaly Extremities: extremities normal, atraumatic, no cyanosis or edema Skin: Skin color, texture, turgor normal. No rashes or lesions Lymph nodes: Cervical, supraclavicular, and axillary nodes normal. No abnormal inguinal nodes palpated Neurologic: Grossly normal   Pelvic: External genitalia:  no lesions              Urethra:  normal appearing urethra with no masses, tenderness or lesions               Bartholins and Skenes: normal                 Vagina: normal appearing vagina with normal color and discharge, no lesions              Cervix: no lesions               Bimanual Exam:  Uterus:  normal size, contour, position, consistency, mobility, non-tender              Adnexa: no mass, fullness, tenderness               Rectovaginal: Confirms               Anus:  normal sphincter tone, no lesions  Chaperone was present for exam.  A:  Well Woman with normal exam  Oligomenorrhea  Menometrorrhagia  Galactorrhea, milky to clear nipple d/c  STD testing  H/O HSIL  Right breast lump at 6 o'clock   P:   Prolactin, TSH, CBC, Ferritin, BhcG, CMP, lipids, vit D  STD testing  Pap with hpv  Get a copy of her last pap and colposcopy as well as her ultrasound  Discussed the need for endometrial protection  Discussed OCP's, mirena IUD, depoprovera  She will call with the antidepressant she is on.   Further plan after her labs return  Right breast ultrasound  In addition to the annual exam, over 10 minutes was spent face to face, over 50% counseling in regards to her galactorrhea and her breast lump.   10/12/16 Addendum: Old records reviewed. Pap 08/05/15 [facility], +HPV. Colposcopy was done under anesthesia, satisfactory and normal appearing. ECC benign Ultrasound 04/11/14: pelvis normal

## 2016-09-22 NOTE — Patient Instructions (Signed)

## 2016-09-22 NOTE — Progress Notes (Signed)
Scheduled patient while in office for right breast ultrasound at [facility] on 09/29/2016 at 11:15 am. Patient is agreeable to date and time. Placed in mammogram hold.

## 2016-09-23 LAB — STD PANEL
HIV 1&2 Ab, 4th Generation: NONREACTIVE
Hepatitis B Surface Ag: NEGATIVE

## 2016-09-23 LAB — PROLACTIN: Prolactin: 4.1 ng/mL

## 2016-09-23 LAB — HCG, SERUM, QUALITATIVE: Preg, Serum: NEGATIVE

## 2016-09-23 LAB — HEPATITIS C ANTIBODY: HCV Ab: NEGATIVE

## 2016-09-23 LAB — VITAMIN D 25 HYDROXY (VIT D DEFICIENCY, FRACTURES): Vit D, 25-Hydroxy: 26 ng/mL — ABNORMAL LOW (ref 30–100)

## 2016-09-24 LAB — IPS N GONORRHOEA AND CHLAMYDIA BY PCR

## 2016-09-24 LAB — IPS PAP TEST WITH HPV

## 2016-09-28 ENCOUNTER — Telehealth: Payer: Self-pay

## 2016-09-28 NOTE — Telephone Encounter (Signed)
Spoke with patient. Advised of message and results as seen below from [place]. Patient verbalizes understanding. Patient is not on any form of birth control and will contact the office with the first day of her next menses to schedule her colposcopy. Patient is taking Citalopram. Advised I will notify Dr.Jertson of medication she is taking and will return call with any further recommendations. Patient is agreeable.

## 2016-09-28 NOTE — Telephone Encounter (Signed)
Patient is returning a call to Kaitlyn. °

## 2016-09-28 NOTE — Telephone Encounter (Signed)
Left message to call Anita [name] at [phone]. 

## 2016-09-28 NOTE — Telephone Encounter (Signed)
-----   Message from Salvadore Dom, MD sent at 09/24/2016  6:40 PM EST ----- Please let the patient know that her vit d level is low. She should start taking 1,000 IU of vit D3 daily (long term). Her pap smear returned with ASCUS, +HPV, please set her up for a colposcopy. She has galactorrhea and her prolactin level is normal. She reports being on a medication for depression and was supposed to call us with the name of it. Some medications can cause galactorrhea so please let me know what she is on.  The rest of her lab work is normal.

## 2016-09-29 DIAGNOSIS — N6310 Unspecified lump in the right breast, unspecified quadrant: Secondary | ICD-10-CM | POA: Diagnosis not present

## 2016-09-29 NOTE — Telephone Encounter (Signed)
It is possible for Citalopram to cause galactorrhea, but it's rare. Please see if her galactorrhea started before or after starting the Citalopram.

## 2016-09-30 NOTE — Telephone Encounter (Signed)
It is rare for Celexa to cause Galactorrhea, given her history it sounds like her Galactorrhea proceeded her Celexa. Please set up a referral for a consultation with Endocrinology.

## 2016-09-30 NOTE — Telephone Encounter (Signed)
Spoke with patient. Advised of message as seen below from [place]. Patient states that she thinks she was experiencing galactorrhea before starting on Citalopram. Patient had right breast ultrasound at [facility] on 09/29/2016 awaiting results. Advised I will review with Dr.Jertson and return call with any further recommendations. Patient is agreeable.

## 2016-10-05 NOTE — Telephone Encounter (Signed)
Spoke with patient. Advised of message as seen below from [place]. Patient verbalizes understanding. Patient declines referral to Endocrinology. States she has been seen by Endocrinology and Neurology in [place] for evaluation previously and all testing was "normal." Patient will have records faxed to [facility] from her prior evaluation. Declines assistance with obtaining records as she states she will have to locate the facilities that she had testing at. Again declines referral stating "I am not going to go through having all of those tests again." Advised I will notify Dr.Jertson of her decision. Patient is agreeable.  Dr.Jertson, anything further for this patient?

## 2016-10-07 NOTE — Telephone Encounter (Signed)
That is fine, I'll await the records.

## 2016-10-15 ENCOUNTER — Encounter: Payer: Self-pay | Admitting: Obstetrics and Gynecology

## 2016-10-22 ENCOUNTER — Telehealth: Payer: Self-pay | Admitting: Obstetrics and Gynecology

## 2016-10-22 ENCOUNTER — Other Ambulatory Visit: Payer: Self-pay | Admitting: *Deleted

## 2016-10-22 DIAGNOSIS — R87610 Atypical squamous cells of undetermined significance on cytologic smear of cervix (ASC-US): Secondary | ICD-10-CM

## 2016-10-22 DIAGNOSIS — R87810 Cervical high risk human papillomavirus (HPV) DNA test positive: Principal | ICD-10-CM

## 2016-10-22 NOTE — Telephone Encounter (Signed)
Patient says she is not sure if she should be on her cycle in order to have a colposcopy.

## 2016-10-22 NOTE — Telephone Encounter (Signed)
Spoke with patient. Patient states she would like to schedule colposcopy but is not sure if she should be on cycle. Advised patient need to schedule just after bleeding stops, but before day 12 day of cycle. LMP 10/16/16, patient reports bleeding can sometimes last 2 weeks. Patient reports bleeding is light now. Patient scheduled for colpo 10/26/16 at 11am with Dr. Talbert Nan. Advised patient to return call to office morning of appt if still bleeding . Advised to take Motrin 800 mg with food and water one hour before procedure. Patient verbalizes understanding and is agreeable to date and time.   Routing to provider for final review. Patient is agreeable to disposition. Will close encounter.   Cc: Lerry Liner, Theresia Lo

## 2016-10-26 ENCOUNTER — Encounter: Payer: Self-pay | Admitting: Obstetrics and Gynecology

## 2016-10-26 ENCOUNTER — Ambulatory Visit (INDEPENDENT_AMBULATORY_CARE_PROVIDER_SITE_OTHER): Payer: BLUE CROSS/BLUE SHIELD | Admitting: Obstetrics and Gynecology

## 2016-10-26 VITALS — BP 128/70 | HR 84 | Resp 14 | Wt 156.0 lb

## 2016-10-26 DIAGNOSIS — R87610 Atypical squamous cells of undetermined significance on cytologic smear of cervix (ASC-US): Secondary | ICD-10-CM

## 2016-10-26 DIAGNOSIS — R87810 Cervical high risk human papillomavirus (HPV) DNA test positive: Secondary | ICD-10-CM | POA: Diagnosis not present

## 2016-10-26 DIAGNOSIS — A630 Anogenital (venereal) warts: Secondary | ICD-10-CM | POA: Diagnosis not present

## 2016-10-26 DIAGNOSIS — N915 Oligomenorrhea, unspecified: Secondary | ICD-10-CM | POA: Diagnosis not present

## 2016-10-26 DIAGNOSIS — O926 Galactorrhea: Secondary | ICD-10-CM | POA: Diagnosis not present

## 2016-10-26 DIAGNOSIS — B977 Papillomavirus as the cause of diseases classified elsewhere: Secondary | ICD-10-CM | POA: Diagnosis not present

## 2016-10-26 DIAGNOSIS — Z01812 Encounter for preprocedural laboratory examination: Secondary | ICD-10-CM

## 2016-10-26 DIAGNOSIS — N643 Galactorrhea not associated with childbirth: Secondary | ICD-10-CM

## 2016-10-26 LAB — POCT URINE PREGNANCY: Preg Test, Ur: NEGATIVE

## 2016-10-26 NOTE — Patient Instructions (Signed)

## 2016-10-26 NOTE — Progress Notes (Signed)
GYNECOLOGY  VISIT   HPI: 25 y.o.   Single  African American  female   [place] with Patient's last menstrual period was 10/16/2016.   here for a colposcopy for an ASCUS, +HPV pap. She has a h/o an ASCUS/+HPV pap, colpo under anesthesia, ECC benign.  She has a h/o oligomenorrhea and galactorrhea with normal TSH and prolactin. She has noticed her last 2 "cycles" were 3-4 days of bleeding starting a few days after intercourse. Cycles are getting more normal. She is having about a cycle a month for the last 3-6 months. Even if she has a cycle she has recently been bleeding a few days after intercourse.  Mostly using condoms, thinks she may want to get pregnant in about a year.  She doesn't like OCP's, nuvaring or depo-provera. She only notices galactorrhea if she expresses.   GYNECOLOGIC HISTORY: Patient's last menstrual period was 10/16/2016. Contraception:none  Menopausal hormone therapy: none         OB History    Gravida Para Term Preterm AB Living   0 0 0 0 0 0   SAB TAB Ectopic Multiple Live Births   0 0 0 0 0         There are no active problems to display for this patient.   Past Medical History:  Diagnosis Date  . Depression   . Hormone disorder   . PCOS (polycystic ovarian syndrome)   . Vitamin D deficiency     Past Surgical History:  Procedure Laterality Date  . COLPOSCOPY      No current outpatient prescriptions on file.   No current facility-administered medications for this visit.      ALLERGIES: E-mycin [erythromycin]  Family History  Problem Relation Age of Onset  . Fibroids Mother     Social History   Social History  . Marital status: Single    Spouse name: N/A  . Number of children: N/A  . Years of education: N/A   Occupational History  . Not on file.   Social History Main Topics  . Smoking status: Current Every Day Smoker    Packs/day: 0.25    Types: Cigarettes  . Smokeless tobacco: Never Used  . Alcohol use 2.4 oz/week    4 Standard  drinks or equivalent per week  . Drug use:     Types: Marijuana  . Sexual activity: Yes    Partners: Male    Birth control/ protection: Condom   Other Topics Concern  . Not on file   Social History Narrative  . No narrative on file    Review of Systems  Constitutional: Negative.   HENT: Negative.   Gastrointestinal: Negative.   Genitourinary: Negative.   Musculoskeletal: Negative.   Skin: Negative.   Neurological: Negative.   Endo/Heme/Allergies: Negative.   Psychiatric/Behavioral: Negative.     PHYSICAL EXAMINATION:    BP 128/70 (BP Location: Right Arm, Patient Position: Sitting, Cuff Size: Normal)   Pulse 84   Resp 14   Wt 156 lb (70.8 kg)   LMP 10/16/2016   BMI 26.36 kg/m     General appearance: alert, cooperative and appears stated age  Pelvic: External genitalia:  no lesions              Urethra:  normal appearing urethra with no masses, tenderness or lesions              Bartholins and Skenes: normal  Vagina: normal appearing vagina with normal color and discharge, no lesions              Cervix: no gros lesions              Colposcopy: unsatisfactory Mild aceto-white changes at 7 o'clock, biopsy done. ECC done Decreased lugols uptake on the left vaginal side wall. Biopsy taken at 3 o'clock Biopsy sites treated with silver nitrate, excellent hemostasis  Chaperone was present for exam.  ASSESSMENT ASCUS/+HPV pap  H/O oligomenorrhea, states recently more normal Some bleeding after intercourse Galactorrhea, normal prolactin level   PLAN Colposcopy with vaginal and cervical biopsy and ECC Calendar cycles, f/u in 3 months Advised not to express nipples Recent negative genprobe    An After Visit Summary was printed and given to the patient.  10 minutes face to face time of which over 50% was spent in counseling.

## 2016-10-30 ENCOUNTER — Telehealth: Payer: Self-pay

## 2016-10-30 NOTE — Telephone Encounter (Signed)
Left message to call Kaitlyn at [phone]. 

## 2016-10-30 NOTE — Telephone Encounter (Signed)
Spoke with patient. Advised of results as seen below from [place]. Patient is agreeable and verbalizes understanding. 08 recall placed.  Routing to provider for final review. Patient agreeable to disposition. Will close encounter.

## 2016-10-30 NOTE — Telephone Encounter (Signed)
-----   Message from Salvadore Dom, MD sent at 10/29/2016  5:48 PM EST ----- Please inform the patient that her cervical and vaginal biopsies show hpv effect, but no dysplasia, ECC is normal. She should have a f/u pap and hpv in one year.

## 2017-04-14 ENCOUNTER — Telehealth: Payer: Self-pay | Admitting: Obstetrics and Gynecology

## 2017-04-14 NOTE — Telephone Encounter (Signed)
Spoke with patient. Patient reports cycles have been irregular, 2 cycles in April and 2 in May. Has started cycle again today. Reports mild mestrual cramping. Denies vaginal odor, discharge or urinary complaints. States no current form of contraceptive. Denies any chance of pregnancy. Recommended OV for further evaluation, declined OV for today. Patient scheduled for 04/15/17 at 11:30am with Dr. Talbert Nan. Patient is agreeable to date and time.  Routing to provider for final review. Patient is agreeable to disposition. Will close encounter.

## 2017-04-14 NOTE — Telephone Encounter (Signed)
Patient has been having abnormal cycles

## 2017-04-15 ENCOUNTER — Encounter: Payer: Self-pay | Admitting: Obstetrics and Gynecology

## 2017-04-15 ENCOUNTER — Ambulatory Visit (INDEPENDENT_AMBULATORY_CARE_PROVIDER_SITE_OTHER): Payer: BLUE CROSS/BLUE SHIELD | Admitting: Obstetrics and Gynecology

## 2017-04-15 VITALS — BP 102/60 | HR 72 | Resp 16 | Ht 64.5 in | Wt 155.0 lb

## 2017-04-15 DIAGNOSIS — G43009 Migraine without aura, not intractable, without status migrainosus: Secondary | ICD-10-CM | POA: Insufficient documentation

## 2017-04-15 DIAGNOSIS — Z113 Encounter for screening for infections with a predominantly sexual mode of transmission: Secondary | ICD-10-CM

## 2017-04-15 DIAGNOSIS — L700 Acne vulgaris: Secondary | ICD-10-CM | POA: Diagnosis not present

## 2017-04-15 DIAGNOSIS — N921 Excessive and frequent menstruation with irregular cycle: Secondary | ICD-10-CM

## 2017-04-15 LAB — POCT URINE PREGNANCY: Preg Test, Ur: NEGATIVE

## 2017-04-15 MED ORDER — DROSPIRENONE-ETHINYL ESTRADIOL 3-0.02 MG PO TABS: 1.0000 | ORAL_TABLET | Freq: Every day | ORAL | 0 refills | Status: DC

## 2017-04-15 MED ORDER — NAPROXEN SODIUM 550 MG PO TABS: 550.0000 mg | ORAL_TABLET | Freq: Two times a day (BID) | ORAL | 2 refills | Status: DC

## 2017-04-15 NOTE — Addendum Note (Signed)
Addended by: Zoila Shutter on: 04/15/2017 02:13 PM   Modules accepted: Orders

## 2017-04-15 NOTE — Progress Notes (Signed)
GYNECOLOGY  VISIT   HPI: 26 y.o.   Single  African American  female   [place] with Patient's last menstrual period was 04/13/2017.   here for irregular menses. Patient states bleeding twoice a month for past 2 months.     The patient has a long h/o irregular menses. When she was seen for her annual exam in 11/17 she reported bleeding every 2 weeks to 8 months. She has bleed for up to 6 weeks at a time and can go through a pad in 30 minutes.  She had a normal pelvic ultrasound in 2015. In 11/17 she had a normal CBC, TSH and prolactin. We discussed the options for cycle control, she choose not do do any of them.  Since November her cycles have been coming 1-2 x a month, the last several months she has been bleeding every 2.5-3.5 weeks x 6-8 days. Heavy for at least 3 days. She is saturating a pad in one hour. Cramps vary, at times bad. Helped with ibuprofen or aleve.  Sexually active, more than one partner, mostly uses condoms.  She has had bad nausea with OCP's in past. She has tried 3 types of OCP's, depo-provera and the nuvaring. Didn't like any of the pills, gained weight on depo-provera. She also c/o worsening acne.  GYNECOLOGIC HISTORY: Patient's last menstrual period was 04/13/2017. Contraception:condoms Menopausal hormone therapy: none        OB History    Gravida Para Term Preterm AB Living   0 0 0 0 0 0   SAB TAB Ectopic Multiple Live Births   0 0 0 0 0         Patient Active Problem List   Diagnosis Date Noted  . Migraine without aura     Past Medical History:  Diagnosis Date  . Depression   . Hormone disorder   . Migraine without aura   . PCOS (polycystic ovarian syndrome)   . Vitamin D deficiency     Past Surgical History:  Procedure Laterality Date  . COLPOSCOPY      Current Outpatient Prescriptions  Medication Sig Dispense Refill  . naproxen sodium (ANAPROX DS) 550 MG tablet Take 1 tablet (550 mg total) by mouth 2 (two) times daily with a meal. 30 tablet 2    No current facility-administered medications for this visit.      ALLERGIES: E-mycin [erythromycin]  Family History  Problem Relation Age of Onset  . Fibroids Mother     Social History   Social History  . Marital status: Single    Spouse name: N/A  . Number of children: N/A  . Years of education: N/A   Occupational History  . Not on file.   Social History Main Topics  . Smoking status: Current Every Day Smoker    Packs/day: 0.25    Types: Cigarettes  . Smokeless tobacco: Never Used  . Alcohol use 2.4 oz/week    4 Standard drinks or equivalent per week  . Drug use: Yes    Types: Marijuana  . Sexual activity: Yes    Partners: Male    Birth control/ protection: Condom   Other Topics Concern  . Not on file   Social History Narrative  . No narrative on file    Review of Systems  Constitutional: Negative.   HENT: Negative.   Eyes: Negative.   Respiratory: Negative.   Cardiovascular: Negative.   Gastrointestinal: Negative.   Genitourinary:       Irregular menses  Musculoskeletal: Negative.   Skin: Negative.   Neurological: Negative.   Endo/Heme/Allergies: Negative.   Psychiatric/Behavioral: Negative.     PHYSICAL EXAMINATION:    BP 102/60 (BP Location: Right Arm, Patient Position: Sitting, Cuff Size: Normal)   Pulse 72   Resp 16   Ht 5' 4.5" (1.638 m)   Wt 155 lb (70.3 kg)   LMP 04/13/2017   BMI 26.19 kg/m     General appearance: alert, cooperative and appears stated age Skin: scarring from acne noted on her face, chest and back Neck: no adenopathy, supple, symmetrical, trachea midline and thyroid normal to inspection and palpation Abdomen: soft, non-tender; bowel sounds normal; no masses,  no organomegaly  Pelvic: External genitalia:  no lesions              Urethra:  normal appearing urethra with no masses, tenderness or lesions              Bartholins and Skenes: normal                 Vagina: normal appearing vagina with normal color and  discharge, no lesions. Moderate bleeding              Cervix: no cervical motion tenderness and no lesions              Bimanual Exam:  Uterus:  normal size, contour, position, consistency, mobility, non-tender              Adnexa: no masses, mildly tender on the right.               Chaperone was present for exam.  ASSESSMENT Abnormal uterine bleeding She has a long h/o irregular cycles, now with menometrorrhagia Dysmenorrhea Acne     PLAN UPT negative CBC, Ferritin, TSH (check hgb now) Discussed options of OCP's and IUD's  GC/CT Recommended consistent use of condoms Anaprox DS for cramps Will start Yaz, no contraindications, risks reviewed, information given F/U in 3 months   An After Visit Summary was printed and given to the patient.  35minutes face to face time of which over 50% was spent in counseling.

## 2017-04-15 NOTE — Patient Instructions (Signed)
Oral Contraception Information Oral contraceptive pills (OCPs) are medicines taken to prevent pregnancy. OCPs work by preventing the ovaries from releasing eggs. The hormones in OCPs also cause the cervical mucus to thicken, preventing the sperm from entering the uterus. The hormones also cause the uterine lining to become thin, not allowing a fertilized egg to attach to the inside of the uterus. OCPs are highly effective when taken exactly as prescribed. However, OCPs do not prevent sexually transmitted diseases (STDs). Safe sex practices, such as using condoms along with the pill, can help prevent STDs. Before taking the pill, you may have a physical exam and Pap test. Your health care provider may order blood tests. The health care provider will make sure you are a good candidate for oral contraception. Discuss with your health care provider the possible side effects of the OCP you may be prescribed. When starting an OCP, it can take 2 to 3 months for the body to adjust to the changes in hormone levels in your body. Types of oral contraception  The combination pill-This pill contains estrogen and progestin (synthetic progesterone) hormones. The combination pill comes in 21-day, 28-day, or 91-day packs. Some types of combination pills are meant to be taken continuously (365-day pills). With 21-day packs, you do not take pills for 7 days after the last pill. With 28-day packs, the pill is taken every day. The last 7 pills are without hormones. Certain types of pills have more than 21 hormone-containing pills. With 91-day packs, the first 84 pills contain both hormones, and the last 7 pills contain no hormones or contain estrogen only.  The minipill-This pill contains the progesterone hormone only. The pill is taken every day continuously. It is very important to take the pill at the same time each day. The minipill comes in packs of 28 pills. All 28 pills contain the hormone. Advantages of oral  contraceptive pills  Decreases premenstrual symptoms.  Treats menstrual period cramps.  Regulates the menstrual cycle.  Decreases a heavy menstrual flow.  May treatacne, depending on the type of pill.  Treats abnormal uterine bleeding.  Treats polycystic ovarian syndrome.  Treats endometriosis.  Can be used as emergency contraception. Things that can make oral contraceptive pills less effective OCPs can be less effective if:  You forget to take the pill at the same time every day.  You have a stomach or intestinal disease that lessens the absorption of the pill.  You take OCPs with other medicines that make OCPs less effective, such as antibiotics, certain HIV medicines, and some seizure medicines.  You take expired OCPs.  You forget to restart the pill on day 7, when using the packs of 21 pills.  Risks associated with oral contraceptive pills Oral contraceptive pills can sometimes cause side effects, such as:  Headache.  Nausea.  Breast tenderness.  Irregular bleeding or spotting.  Combination pills are also associated with a small increased risk of:  Blood clots.  Heart attack.  Stroke.  This information is not intended to replace advice given to you by your health care provider. Make sure you discuss any questions you have with your health care provider. Document Released: 01/16/2003 Document Revised: 04/02/2016 Document Reviewed: 04/16/2013 Elsevier Interactive Patient Education  2018 [employer] Inc[employer]  

## 2017-04-16 LAB — CBC
Hematocrit: 40.2 % (ref 34.0–46.6)
Hemoglobin: 13.1 g/dL (ref 11.1–15.9)
MCH: 30.4 pg (ref 26.6–33.0)
MCHC: 32.6 g/dL (ref 31.5–35.7)
MCV: 93 fL (ref 79–97)
Platelets: 212 10*3/uL (ref 150–379)
RBC: 4.31 x10E6/uL (ref 3.77–5.28)
RDW: 12.6 % (ref 12.3–15.4)
WBC: 6.3 10*3/uL (ref 3.4–10.8)

## 2017-04-16 LAB — HEPATITIS C ANTIBODY: Hep C Virus Ab: 0.1 s/co ratio (ref 0.0–0.9)

## 2017-04-16 LAB — FERRITIN: Ferritin: 20 ng/mL (ref 15–150)

## 2017-04-16 LAB — HEP, RPR, HIV PANEL
HIV Screen 4th Generation wRfx: NONREACTIVE
Hepatitis B Surface Ag: NEGATIVE
RPR Ser Ql: NONREACTIVE

## 2017-04-16 LAB — TSH: TSH: 1.22 u[IU]/mL (ref 0.450–4.500)

## 2017-04-19 LAB — GC/CHLAMYDIA PROBE AMP
Chlamydia trachomatis, NAA: NEGATIVE
Neisseria gonorrhoeae by PCR: NEGATIVE

## 2017-07-15 ENCOUNTER — Telehealth: Payer: Self-pay | Admitting: Obstetrics and Gynecology

## 2017-07-15 NOTE — Telephone Encounter (Signed)
Patient is having irregular cycle with blood clots.

## 2017-07-15 NOTE — Telephone Encounter (Signed)
Spoke with patient. Patient states that 7 days ago her menses stopped. Then on 07/13/2017 it restarted. Bleeding is heavier than normal. Changing her pad every 2 hours due to it being full. Having increased cramping and passing small clots. Patient is not currently on any form of birth control. Stopped OCP after taking for 2 days. Reports it made her feel "groggy." Advised patient she will need to be seen for further evaluation. Appointment scheduled for 07/16/2017 at 11:30 am with Dr.Jertson. Patient is agreeable to date and time. Advised if bleeding increases to having to change her pad every hour for more than two hours due to bleeding through will need to be seen for immediate evaluation with our office or ER. Patient is agreeable.  Routing to provider for final review. Patient agreeable to disposition. Will close encounter.

## 2017-07-16 ENCOUNTER — Telehealth: Payer: Self-pay | Admitting: Obstetrics and Gynecology

## 2017-07-16 ENCOUNTER — Ambulatory Visit: Payer: BLUE CROSS/BLUE SHIELD | Admitting: Obstetrics and Gynecology

## 2017-07-16 NOTE — Telephone Encounter (Signed)
Call transferred to Practice Administrator from front desk.   Patient called to state she would be later for her appointment today. Advised patient she would need to reschedule appointment for another time due to her being late. Advised patient she can go to urgent care or emergency department if she wishes. Patient frustrated and upset, wants to speak directly to physician. Advised patient could receive call back from nursing supervisor, patient declined call back from nursing supervisor and then disconnected the call.   Routing to provider for final review. . Will close encounter.

## 2017-08-02 ENCOUNTER — Telehealth: Payer: Self-pay | Admitting: Obstetrics and Gynecology

## 2017-08-02 NOTE — Telephone Encounter (Signed)
Patient called requesting an appointment for irregular bleeding between cycles. She did not keep her last appointment scheduled for this same reason.  Last seen: 04/15/17

## 2017-08-02 NOTE — Telephone Encounter (Signed)
Patient complaining of having 3 menses in the past 5-6 weeks with cramping, clots and normal bleeding. She is using condoms for birth control. She is requesting office visit. Made appointment with Dr.Jertson for tomorrow at 2:15pm.

## 2017-08-03 ENCOUNTER — Encounter: Payer: Self-pay | Admitting: Obstetrics and Gynecology

## 2017-08-03 ENCOUNTER — Ambulatory Visit (INDEPENDENT_AMBULATORY_CARE_PROVIDER_SITE_OTHER): Payer: BLUE CROSS/BLUE SHIELD | Admitting: Obstetrics and Gynecology

## 2017-08-03 VITALS — BP 122/80 | HR 64 | Resp 14 | Wt 149.0 lb

## 2017-08-03 DIAGNOSIS — N946 Dysmenorrhea, unspecified: Secondary | ICD-10-CM

## 2017-08-03 DIAGNOSIS — N921 Excessive and frequent menstruation with irregular cycle: Secondary | ICD-10-CM

## 2017-08-03 NOTE — Progress Notes (Signed)
GYNECOLOGY  VISIT   HPI: 26 y.o.   Single  African American  female   Stuart with Patient's last menstrual period was 08/01/2017.   here c/o irregular menstrual bleeding    The patient has a long h/o irregular cycles. She had a normal pelvic ultrasound in 2015. In 11/17 she had a normal CBC, TSH and prolactin.  She hasn't tolerated OCP's, nuvaring or depo-provera. At her last visit in 6/18 she was started on Yaz (she has tried 4 different pills), she only stayed on it for 2 days, was nausea.  Over the last 3 months she has been having continued abnormal bleeding. 6/4-6/11 7/3-7/7 7/27-8/1 8/22-27 9/4-9/8 9/23 started again. Typically bleeding for 6 days. Saturating a pad in an hour (no change), normal CBC in 6/18. Cramps are terrible.  Not sexually active since she was here in June and had a negative UPT and negative STD testing.   GYNECOLOGIC HISTORY: Patient's last menstrual period was 08/01/2017. Contraception:none Menopausal hormone therapy: none        OB History    Gravida Para Term Preterm AB Living   0 0 0 0 0 0   SAB TAB Ectopic Multiple Live Births   0 0 0 0 0         Patient Active Problem List   Diagnosis Date Noted  . Migraine without aura     Past Medical History:  Diagnosis Date  . Depression   . Hormone disorder   . Migraine without aura   . PCOS (polycystic ovarian syndrome)   . Vitamin D deficiency     Past Surgical History:  Procedure Laterality Date  . COLPOSCOPY      No current outpatient prescriptions on file.   No current facility-administered medications for this visit.      ALLERGIES: E-mycin [erythromycin]  Family History  Problem Relation Age of Onset  . Fibroids Mother     Social History   Social History  . Marital status: Single    Spouse name: N/A  . Number of children: N/A  . Years of education: N/A   Occupational History  . Not on file.   Social History Main Topics  . Smoking status: Current Every Day Smoker     Packs/day: 0.25    Types: Cigarettes  . Smokeless tobacco: Never Used  . Alcohol use 2.4 oz/week    4 Standard drinks or equivalent per week  . Drug use: Yes    Types: Marijuana  . Sexual activity: Yes    Partners: Male    Birth control/ protection: Condom   Other Topics Concern  . Not on file   Social History Narrative  . No narrative on file    Review of Systems  Constitutional: Positive for malaise/fatigue.  HENT: Negative.   Eyes: Negative.   Respiratory: Negative.   Cardiovascular: Negative.   Gastrointestinal: Positive for nausea.  Genitourinary:       Irregular uterine bleeding- 3 menstrual cycles with-in 35 days  Right breast pain/lump Loss of sexual interest   Musculoskeletal: Negative.   Skin: Negative.   Neurological: Negative.   Endo/Heme/Allergies: Negative.        Heat and cold intolerance   Psychiatric/Behavioral: The patient is nervous/anxious.        Sleep disturbance     PHYSICAL EXAMINATION:    BP 122/80 (BP Location: Right Arm, Patient Position: Sitting, Cuff Size: Normal)   Pulse 64   Resp 14   Wt 149 lb (67.6 kg)  LMP 08/01/2017   BMI 25.18 kg/m     General appearance: alert, cooperative and appears stated age  ASSESSMENT Irregular cycles, normal TSH Menorrhagia, normal CBC Severe dysmenorrhea. Hasn't tolerated OCP's or the nuvaring. Doesn't want depo-provera again and doesn't want an IUD She "wants everything out"    PLAN Will set her up for another ultrasound Will then schedule for diagnostic laparoscopy, possible treatment of endometriosis, possible hysteroscopy (depending on ultrasound) I would be very hesitant to perform a hysterectomy on her, I explained the reasoning   An After Visit Summary was printed and given to the patient.  ~15 minutes face to face time of which over 50% was spent in counseling.

## 2017-08-04 ENCOUNTER — Telehealth: Payer: Self-pay | Admitting: Obstetrics and Gynecology

## 2017-08-04 NOTE — Telephone Encounter (Signed)
Spoke with patient regarding benefits for a recommended ultrasound. Patient states she spoke with Dr Talbert Nan at her appointment, and advises she does not desire to proceed with the ultrasound, but is considering the surgical options available. Patient states she will call back to advise how she would like to proceed.  Routing to Dr Talbert Nan

## 2017-08-05 NOTE — Telephone Encounter (Signed)
Will close the encounter. Cancel the ultrasound.

## 2017-08-24 ENCOUNTER — Encounter: Payer: Self-pay | Admitting: Obstetrics and Gynecology

## 2017-08-24 ENCOUNTER — Telehealth: Payer: Self-pay | Admitting: Obstetrics and Gynecology

## 2017-08-24 NOTE — Telephone Encounter (Signed)
Patient would like a prescription for the depo provera sent to [facility] on [address] Pharmacy phone number is [phone].

## 2017-08-24 NOTE — Telephone Encounter (Signed)
It is okay for the patient to restart Depo-Provera if she would like to. She should be on her cycle when starting, but she has had some irregular bleeding, need to make sure she isn't pregnant when she starts it. She needs at least 2 weeks of protected intercourse or no intercourse and a negative pregnancy test prior to starting. She is due for an annual exam next month.

## 2017-08-24 NOTE — Telephone Encounter (Signed)
Medication refill request: Depo Provera Last AEX:  09/22/16 JJ Next AEX: none Last MMG (if hormonal medication request): none Refill authorized: please advise.

## 2017-08-25 NOTE — Telephone Encounter (Signed)
Spoke with patient. Advised of message as seen below from [place]. Patient states she has not been sexually active in 1 month. Aware she will have a pregnancy test before she is given Depo. If this is negative she may receive her Depo injection. Advised she is due for aex next month. Added note to patient's appointment desk to schedule when she is in the office.  Routing to provider for final review. Patient agreeable to disposition. Will close encounter.

## 2017-08-25 NOTE — Telephone Encounter (Signed)
Left message to call Klare Criss at [phone]. 

## 2017-08-26 ENCOUNTER — Ambulatory Visit (INDEPENDENT_AMBULATORY_CARE_PROVIDER_SITE_OTHER): Payer: BLUE CROSS/BLUE SHIELD | Admitting: *Deleted

## 2017-08-26 VITALS — BP 108/60 | HR 76 | Resp 14 | Ht 64.5 in | Wt 152.0 lb

## 2017-08-26 DIAGNOSIS — Z3042 Encounter for surveillance of injectable contraceptive: Secondary | ICD-10-CM

## 2017-08-26 LAB — POCT URINE PREGNANCY: Preg Test, Ur: NEGATIVE

## 2017-08-26 MED ORDER — MEDROXYPROGESTERONE ACETATE 150 MG/ML IM SUSP
150.0000 mg | Freq: Once | INTRAMUSCULAR | Status: AC
  Administered 2017-08-26: 150 mg via INTRAMUSCULAR

## 2017-08-26 NOTE — Addendum Note (Signed)
Addended by: Polly Cobia on: 08/26/2017 03:33 PM   Modules accepted: Orders

## 2017-08-26 NOTE — Progress Notes (Signed)
Patient is here for Depo Provera Injection Patient is within Depo Provera Calender Limits: Fist injection today. Patient states she has abstain for more than 2 weeks. UPT=neg today Next Depo Due between: 1/3 - 1/17 Last AEX: 09/22/16 JJ AEX Scheduled: not scheduled.   Advised patient she needs AEX before next depo.  Patient states she had insurance issues this year and had to go back to her PCP in [place] for her AEX last week. She states she lives in [place] now and will be coming here for her visits.  Release form was signed at check out to get her pap and all her lab work from last week.   Patient is aware when next depo is due.   Pt tolerated Injection well. Right Gluteus.   Routed to provider for review, encounter closed.

## 2017-09-03 ENCOUNTER — Telehealth: Payer: Self-pay

## 2017-09-03 NOTE — Telephone Encounter (Signed)
-----   Message from Salvadore Dom, MD sent at 08/30/2017  4:55 PM EDT -----   ----- Message ----- From: Joretta Bachelor, PA Sent: 08/27/2017   4:45 PM To: Salvadore Dom, MD  Hey was this sent in error, or is she going to come here? ----- Message ----- From: Salvadore Dom, MD Sent: 08/26/2017   4:48 PM To: Joretta Bachelor, PA  As per our conversation, we need a copy of the pap that she had in [place]. She had an abnormal pap here last year and is due for f/u in 11/18. If her annual was completed in [place] and we have a documented normal pap, then I can refill her depo-provera. I will want her to f/u secondary to pelvic pain. She should be seen prior to her next depo shot.

## 2017-09-03 NOTE — Telephone Encounter (Signed)
Spoke with patient. Patient states that she believes she did have a pap at her annual in [place]. States she signed a release of records when she was here in the office for this to be sent to [facility] for review. Agreeable to schedule an appointment for f/u secondary to pelvic pain before next injection. Declines to schedule at this time.

## 2017-09-03 NOTE — Telephone Encounter (Signed)
Left message to call Kaitlyn at [phone]. 

## 2017-09-10 ENCOUNTER — Telehealth: Payer: Self-pay | Admitting: Obstetrics and Gynecology

## 2017-09-10 NOTE — Telephone Encounter (Signed)
Please let the patient know that I got a copy of her visit to GYN in [place] from 08/17/17, they did not do an annual or a pap. They were under the impression she was UTD on her pap. They discussed her irregular cycles, pelvic pain and hidradenitis suppurativa. She still needs an annual exam and a pap (her pap was abnormal last year).

## 2017-09-10 NOTE — Telephone Encounter (Signed)
Spoke with patient. Advised of message as seen below from Anita [name]. Patient states "If we can not figure out what is wrong with me I do not know why I need to keep coming back. Every time I turn around I am back at the doctor having something done. I just want to live my life for a little while." Advised of importance of having an annual and pap smear performed as she is overdue and had an abnormal pap last year. Advised will not be able to receive next Depo injection which is due 1/3-1/17 if she is not up to date. Declines to schedule annual at this time. Patient is in 08 recall.  QI:[id number] Orvan Seen, RN

## 2017-09-20 ENCOUNTER — Telehealth: Payer: Self-pay | Admitting: Obstetrics and Gynecology

## 2017-09-20 NOTE — Telephone Encounter (Signed)
Left message to call Geetika Laborde at [phone].  

## 2017-09-20 NOTE — Telephone Encounter (Signed)
Patient is having some "strange symptoms" after taking Depo and  B.V medication.

## 2017-09-21 NOTE — Telephone Encounter (Signed)
Patient returning your call.

## 2017-09-21 NOTE — Telephone Encounter (Signed)
Returned call to patient. Patient states that approximately 5 days ago, she had some brown discharge. LMP was 09/03/17. Patient got first depo injection on 08/26/17. RN advised patient the discharge could be BTB after starting the depo. Patient states the discharge has now become a creamy color. Denies itching, irritation, pain or odor. Patient states she completed her flagyl treatment last week. RN advised discharge can sometimes follow the treatment of flagyl and advised patient to monitor discharge and return call to office if she begins to have itching, irritation, pain or odor. Patient agreeable.   Routing to provider for final review. Patient agreeable to disposition. Will close encounter.

## 2017-11-04 ENCOUNTER — Ambulatory Visit: Payer: BLUE CROSS/BLUE SHIELD | Admitting: Obstetrics and Gynecology

## 2017-11-22 ENCOUNTER — Ambulatory Visit (INDEPENDENT_AMBULATORY_CARE_PROVIDER_SITE_OTHER): Payer: BLUE CROSS/BLUE SHIELD | Admitting: Obstetrics and Gynecology

## 2017-11-22 ENCOUNTER — Other Ambulatory Visit (HOSPITAL_COMMUNITY)
Admission: RE | Admit: 2017-11-22 | Discharge: 2017-11-22 | Disposition: A | Discharge status: Home / Self Care | Payer: BLUE CROSS/BLUE SHIELD | Source: Ambulatory Visit | Attending: Obstetrics and Gynecology | Admitting: Obstetrics and Gynecology

## 2017-11-22 ENCOUNTER — Encounter: Payer: Self-pay | Admitting: Obstetrics and Gynecology

## 2017-11-22 ENCOUNTER — Other Ambulatory Visit: Payer: Self-pay

## 2017-11-22 VITALS — BP 118/78 | HR 72 | Resp 14 | Ht 64.5 in | Wt 151.0 lb

## 2017-11-22 DIAGNOSIS — Z01419 Encounter for gynecological examination (general) (routine) without abnormal findings: Secondary | ICD-10-CM

## 2017-11-22 DIAGNOSIS — Z113 Encounter for screening for infections with a predominantly sexual mode of transmission: Secondary | ICD-10-CM | POA: Diagnosis not present

## 2017-11-22 DIAGNOSIS — R102 Pelvic and perineal pain: Secondary | ICD-10-CM | POA: Diagnosis not present

## 2017-11-22 DIAGNOSIS — Z308 Encounter for other contraceptive management: Secondary | ICD-10-CM

## 2017-11-22 DIAGNOSIS — Z87410 Personal history of cervical dysplasia: Secondary | ICD-10-CM | POA: Diagnosis not present

## 2017-11-22 DIAGNOSIS — Z0000 Encounter for general adult medical examination without abnormal findings: Secondary | ICD-10-CM

## 2017-11-22 DIAGNOSIS — Z124 Encounter for screening for malignant neoplasm of cervix: Secondary | ICD-10-CM | POA: Insufficient documentation

## 2017-11-22 DIAGNOSIS — F418 Other specified anxiety disorders: Secondary | ICD-10-CM

## 2017-11-22 DIAGNOSIS — Z3042 Encounter for surveillance of injectable contraceptive: Secondary | ICD-10-CM | POA: Diagnosis not present

## 2017-11-22 MED ORDER — MEDROXYPROGESTERONE ACETATE 150 MG/ML IM SUSP
150.0000 mg | Freq: Once | INTRAMUSCULAR | Status: AC
  Administered 2017-11-22: 150 mg via INTRAMUSCULAR

## 2017-11-22 NOTE — Patient Instructions (Signed)

## 2017-11-22 NOTE — Progress Notes (Signed)
Patient is here for Depo Provera Injection Patient is within Depo Provera Calender Limits 1/3-1/17/19  Next Depo Due between: 4/1-4/15-19 Last AEX: today 11-22-17 AEX Scheduled: not yet scheduled   Patient is aware when next depo is due  Pt tolerated Injection well.  Routed to provider for review, encounter closed.

## 2017-11-22 NOTE — Progress Notes (Signed)
27 y.o. G0P0000 SingleAfrican AmericanF here for annual exam.  The patient has a h/o menometorrhagia and severe dysmenorrhea. Didn't tolerate OCP's or the nuvaring. Didn't want the mirena. She had normal lab work and a normal u/s in the fall ([place]). She restarted depo-provera in 10/18. Not bleeding. She has lost some weight. She c/o a decreased libido. She is not currently in a relationship. She had some decrease in libido prior to the depo, not as much as now.  She c/o an increase in vaginal d/c in the last few months. No odor, no itching, burning or irritation.  She is having symptoms of depression, worse in the last few weeks. Has support. Loss of joy, doesn't want to do things. No thoughts of hurting herself or others.  She has occasional intermittent abdominal cramps.  Last sexually active in November, no pain with intercourse    No LMP recorded. Patient has had an injection.          Sexually active: Yes.    The current method of family planning is Depo-Provera injections.    Exercising: Yes.    walking Smoker:  Smokes marijuana daily   Health Maintenance: Pap:  09-22-16 ASCUS + HR HPV colpo- cervical and vaginal biopsies show hpv effect, but no dysplasia, ECC is normal History of abnormal Pap:  yes TDaP:  Unsure  Gardasil: completed all 3    reports that  has never smoked. she has never used smokeless tobacco. She reports that she drinks about 4.2 oz of alcohol per week. She reports that she uses drugs. Drug: Marijuana. She works as a Educational psychologist at [employer]   Past Medical History:  Diagnosis Date  . Depression   . Hormone disorder   . Migraine without aura   . PCOS (polycystic ovarian syndrome)   . Vitamin D deficiency     Past Surgical History:  Procedure Laterality Date  . COLPOSCOPY      Current Outpatient Medications  Medication Sig Dispense Refill  . medroxyPROGESTERone (DEPO-PROVERA) 150 MG/ML injection Inject 150 mg into the muscle every 3 (three)  months.     No current facility-administered medications for this visit.     Family History  Problem Relation Age of Onset  . Fibroids Mother     Review of Systems  Constitutional: Negative.   HENT: Negative.   Eyes: Negative.   Respiratory: Negative.   Cardiovascular: Negative.   Gastrointestinal: Negative.   Endocrine: Negative.   Genitourinary: Negative.        Right Breast lump bilateral nipple discharge   Loss of sexual interest Vaginal discharge   Musculoskeletal: Negative.   Skin: Negative.   Allergic/Immunologic: Negative.   Neurological: Negative.   Psychiatric/Behavioral: Negative.     Exam:   BP 118/78 (BP Location: Right Arm, Patient Position: Sitting, Cuff Size: Normal)   Pulse 72   Resp 14   Ht 5' 4.5" (1.638 m)   Wt 151 lb (68.5 kg)   BMI 25.52 kg/m   Weight change: @WEIGHTCHANGE @ Height:   Height: 5' 4.5" (163.8 cm)  Ht Readings from Last 3 Encounters:  11/22/17 5' 4.5" (1.638 m)  08/26/17 5' 4.5" (1.638 m)  04/15/17 5' 4.5" (1.638 m)    General appearance: alert, cooperative and appears stated age. Tearful during her visit, off and on.  Head: Normocephalic, without obvious abnormality, atraumatic Neck: no adenopathy, supple, symmetrical, trachea midline and thyroid normal to inspection and palpation Lungs: clear to auscultation bilaterally Cardiovascular: regular rate  and rhythm Breasts: stable pea sized lump in the right breast at 6 o'clock, a few cm from the areolar region. No concerning changes in either breast.  Abdomen: soft, non-tender; non distended,  no masses,  no organomegaly Extremities: extremities normal, atraumatic, no cyanosis or edema Skin: Skin color, texture, turgor normal. No rashes or lesions Lymph nodes: Cervical, supraclavicular, and axillary nodes normal. No abnormal inguinal nodes palpated Neurologic: Grossly normal   Pelvic: External genitalia:  no lesions              Urethra:  normal appearing urethra with no  masses, tenderness or lesions              Bartholins and Skenes: normal                 Vagina: normal appearing vagina with normal color and discharge, no lesions              Cervix: no lesions and +/- CMT               Bimanual Exam:  Uterus:  tender, mobile, normal sized              Adnexa: no masses, tender on the right               Rectovaginal: Confirms               Anus:  normal sphincter tone, no lesions  Pelvic floor: very tender bilaterally  Chaperone was present for exam.  A:  Well Woman with normal exam  STD testing  H/O abnormal pap. H/O HSIL in 2016  Depression and anxiety  Diffuse tenderness on pelvic exam, including pelvic floor (normal u/s in the fall)  Vaginal d/c  P:   Affirm  Pap with hpv  Std testing  Screening labs   Recommend monthly BSE  Continue with depo-provera  Names of counselors given, discussed medication  She will consider medication. If she calls back for medication will start her on Lexapro

## 2017-11-23 LAB — LIPID PANEL
Chol/HDL Ratio: 2.8 ratio (ref 0.0–4.4)
Cholesterol, Total: 149 mg/dL (ref 100–199)
HDL: 54 mg/dL (ref >39)
LDL Calculated: 78 mg/dL (ref 0–99)
Triglycerides: 84 mg/dL (ref 0–149)
VLDL Cholesterol Cal: 17 mg/dL (ref 5–40)

## 2017-11-23 LAB — CBC
Hematocrit: 41.4 % (ref 34.0–46.6)
Hemoglobin: 13.7 g/dL (ref 11.1–15.9)
MCH: 30.6 pg (ref 26.6–33.0)
MCHC: 33.1 g/dL (ref 31.5–35.7)
MCV: 92 fL (ref 79–97)
Platelets: 219 10*3/uL (ref 150–379)
RBC: 4.48 x10E6/uL (ref 3.77–5.28)
RDW: 13.1 % (ref 12.3–15.4)
WBC: 7.7 10*3/uL (ref 3.4–10.8)

## 2017-11-23 LAB — COMPREHENSIVE METABOLIC PANEL
ALT: 32 IU/L (ref 0–32)
AST: 26 IU/L (ref 0–40)
Albumin/Globulin Ratio: 1.7 (ref 1.2–2.2)
Albumin: 4.6 g/dL (ref 3.5–5.5)
Alkaline Phosphatase: 77 IU/L (ref 39–117)
BUN/Creatinine Ratio: 10 (ref 9–23)
BUN: 7 mg/dL (ref 6–20)
Bilirubin Total: 0.4 mg/dL (ref 0.0–1.2)
CO2: 25 mmol/L (ref 20–29)
Calcium: 9.7 mg/dL (ref 8.7–10.2)
Chloride: 105 mmol/L (ref 96–106)
Creatinine, Ser: 0.7 mg/dL (ref 0.57–1.00)
GFR calc Af Amer: 138 mL/min/{1.73_m2} (ref >59)
GFR calc non Af Amer: 120 mL/min/{1.73_m2} (ref >59)
Globulin, Total: 2.7 g/dL (ref 1.5–4.5)
Glucose: 84 mg/dL (ref 65–99)
Potassium: 4.1 mmol/L (ref 3.5–5.2)
Sodium: 143 mmol/L (ref 134–144)
Total Protein: 7.3 g/dL (ref 6.0–8.5)

## 2017-11-23 LAB — HEPATITIS C ANTIBODY: Hep C Virus Ab: 0.1 s/co ratio (ref 0.0–0.9)

## 2017-11-23 LAB — VAGINITIS/VAGINOSIS, DNA PROBE
Candida Species: NEGATIVE
Gardnerella vaginalis: NEGATIVE
Trichomonas vaginosis: NEGATIVE

## 2017-11-23 LAB — HEP, RPR, HIV PANEL
HIV Screen 4th Generation wRfx: NONREACTIVE
Hepatitis B Surface Ag: NEGATIVE
RPR Ser Ql: NONREACTIVE

## 2017-11-24 ENCOUNTER — Telehealth: Payer: Self-pay | Admitting: Obstetrics and Gynecology

## 2017-11-24 LAB — CYTOLOGY - PAP
Chlamydia: NEGATIVE
Diagnosis: NEGATIVE
HPV: NOT DETECTED
Neisseria Gonorrhea: NEGATIVE

## 2017-11-24 NOTE — Telephone Encounter (Signed)
Patient called and requested her results.

## 2017-11-24 NOTE — Telephone Encounter (Signed)
Spoke with patient. Advised of results as seen below from [place]. Patient is agreeable and verbalizes understanding. Encounter closed.  Notes recorded by Nicholes Rough, LPN on 8/71/9597 at 4:71 PM EST Left message with normal results (ok per DPR) Patient in 02 recall -eh ------  Notes recorded by Salvadore Dom, MD on 11/24/2017 at 12:55 PM EST Please advise the patient of normal results. Please inform pap and hpv are negative, she can space out her pap to 3 years.

## 2018-02-07 ENCOUNTER — Telehealth: Payer: Self-pay | Admitting: Obstetrics and Gynecology

## 2018-02-07 ENCOUNTER — Ambulatory Visit: Payer: BLUE CROSS/BLUE SHIELD

## 2018-02-07 NOTE — Telephone Encounter (Signed)
Routing to Dr.Jertson as FYI. Encounter closed.

## 2018-02-07 NOTE — Telephone Encounter (Signed)
Patient cancelled depo appointment. She did not wish to reschedule right now because she is thinking about other birth control options.

## 2018-03-28 ENCOUNTER — Ambulatory Visit (INDEPENDENT_AMBULATORY_CARE_PROVIDER_SITE_OTHER): Payer: BLUE CROSS/BLUE SHIELD | Admitting: Obstetrics and Gynecology

## 2018-03-28 ENCOUNTER — Other Ambulatory Visit: Payer: Self-pay

## 2018-03-28 ENCOUNTER — Encounter: Payer: Self-pay | Admitting: Obstetrics and Gynecology

## 2018-03-28 VITALS — BP 122/70 | HR 84 | Resp 14 | Wt 155.0 lb

## 2018-03-28 DIAGNOSIS — B373 Candidiasis of vulva and vagina: Secondary | ICD-10-CM | POA: Diagnosis not present

## 2018-03-28 DIAGNOSIS — B3731 Acute candidiasis of vulva and vagina: Secondary | ICD-10-CM

## 2018-03-28 DIAGNOSIS — N760 Acute vaginitis: Secondary | ICD-10-CM

## 2018-03-28 DIAGNOSIS — Z789 Other specified health status: Secondary | ICD-10-CM | POA: Diagnosis not present

## 2018-03-28 DIAGNOSIS — Z8742 Personal history of other diseases of the female genital tract: Secondary | ICD-10-CM | POA: Diagnosis not present

## 2018-03-28 DIAGNOSIS — B9689 Other specified bacterial agents as the cause of diseases classified elsewhere: Secondary | ICD-10-CM

## 2018-03-28 DIAGNOSIS — IMO0001 Reserved for inherently not codable concepts without codable children: Secondary | ICD-10-CM

## 2018-03-28 LAB — POCT URINE PREGNANCY: Preg Test, Ur: NEGATIVE

## 2018-03-28 MED ORDER — FLUCONAZOLE 150 MG PO TABS: 150.0000 mg | ORAL_TABLET | Freq: Once | ORAL | 0 refills | Status: AC

## 2018-03-28 MED ORDER — METRONIDAZOLE 500 MG PO TABS
500.0000 mg | ORAL_TABLET | Freq: Two times a day (BID) | ORAL | 0 refills | Status: DC
Start: 2018-03-28 — End: 2018-06-14

## 2018-03-28 NOTE — Patient Instructions (Addendum)
Vaginal Yeast infection, Adult Vaginal yeast infection is a condition that causes soreness, swelling, and redness (inflammation) of the vagina. It also causes vaginal discharge. This is a common condition. Some women get this infection frequently. What are the causes? This condition is caused by a change in the normal balance of the yeast (candida) and bacteria that live in the vagina. This change causes an overgrowth of yeast, which causes the inflammation. What increases the risk? This condition is more likely to develop in:  Women who take antibiotic medicines.  Women who have diabetes.  Women who take birth control pills.  Women who are pregnant.  Women who douche often.  Women who have a weak defense (immune) system.  Women who have been taking steroid medicines for a long time.  Women who frequently wear tight clothing.  What are the signs or symptoms? Symptoms of this condition include:  White, thick vaginal discharge.  Swelling, itching, redness, and irritation of the vagina. The lips of the vagina (vulva) may be affected as well.  Pain or a burning feeling while urinating.  Pain during sex.  How is this diagnosed? This condition is diagnosed with a medical history and physical exam. This will include a pelvic exam. Your health care provider will examine a sample of your vaginal discharge under a microscope. Your health care provider may send this sample for testing to confirm the diagnosis. How is this treated? This condition is treated with medicine. Medicines may be over-the-counter or prescription. You may be told to use one or more of the following:  Medicine that is taken orally.  Medicine that is applied as a cream.  Medicine that is inserted directly into the vagina (suppository).  Follow these instructions at home:  Take or apply over-the-counter and prescription medicines only as told by your health care provider.  Do not have sex until your health  care provider has approved. Tell your sex partner that you have a yeast infection. That person should go to his or her health care provider if he or she develops symptoms.  Do not wear tight clothes, such as pantyhose or tight pants.  Avoid using tampons until your health care provider approves.  Eat more yogurt. This may help to keep your yeast infection from returning.  Try taking a sitz bath to help with discomfort. This is a warm water bath that is taken while you are sitting down. The water should only come up to your hips and should cover your buttocks. Do this 3-4 times per day or as told by your health care provider.  Do not douche.  Wear breathable, cotton underwear.  If you have diabetes, keep your blood sugar levels under control. Contact a health care provider if:  You have a fever.  Your symptoms go away and then return.  Your symptoms do not get better with treatment.  Your symptoms get worse.  You have new symptoms.  You develop blisters in or around your vagina.  You have blood coming from your vagina and it is not your menstrual period.  You develop pain in your abdomen. This information is not intended to replace advice given to you by your health care provider. Make sure you discuss any questions you have with your health care provider. Document Released: 08/05/2005 Document Revised: 04/08/2016 Document Reviewed: 04/29/2015 Elsevier Interactive Patient Education  2018 [employer]. Bacterial Vaginosis Bacterial vaginosis is a vaginal infection that occurs when the normal balance of bacteria in the vagina is disrupted.   It results from an overgrowth of certain bacteria. This is the most common vaginal infection among women ages 75-44. Because bacterial vaginosis increases your risk for STIs (sexually transmitted infections), getting treated can help reduce your risk for chlamydia, gonorrhea, herpes, and HIV (human immunodeficiency virus). Treatment is also  important for preventing complications in pregnant women, because this condition can cause an early (premature) delivery. What are the causes? This condition is caused by an increase in harmful bacteria that are normally present in small amounts in the vagina. However, the reason that the condition develops is not fully understood. What increases the risk? The following factors may make you more likely to develop this condition:  Having a new sexual partner or multiple sexual partners.  Having unprotected sex.  Douching.  Having an intrauterine device (IUD).  Smoking.  Drug and alcohol abuse.  Taking certain antibiotic medicines.  Being pregnant.  You cannot get bacterial vaginosis from toilet seats, bedding, swimming pools, or contact with objects around you. What are the signs or symptoms? Symptoms of this condition include:  Grey or white vaginal discharge. The discharge can also be watery or foamy.  A fish-like odor with discharge, especially after sexual intercourse or during menstruation.  Itching in and around the vagina.  Burning or pain with urination.  Some women with bacterial vaginosis have no signs or symptoms. How is this diagnosed? This condition is diagnosed based on:  Your medical history.  A physical exam of the vagina.  Testing a sample of vaginal fluid under a microscope to look for a large amount of bad bacteria or abnormal cells. Your health care provider may use a cotton swab or a small wooden spatula to collect the sample.  How is this treated? This condition is treated with antibiotics. These may be given as a pill, a vaginal cream, or a medicine that is put into the vagina (suppository). If the condition comes back after treatment, a second round of antibiotics may be needed. Follow these instructions at home: Medicines  Take over-the-counter and prescription medicines only as told by your health care provider.  Take or use your antibiotic  as told by your health care provider. Do not stop taking or using the antibiotic even if you start to feel better. General instructions  If you have a female sexual partner, tell her that you have a vaginal infection. She should see her health care provider and be treated if she has symptoms. If you have a female sexual partner, he does not need treatment.  During treatment: ? Avoid sexual activity until you finish treatment. ? Do not douche. ? Avoid alcohol as directed by your health care provider. ? Avoid breastfeeding as directed by your health care provider.  Drink enough water and fluids to keep your urine clear or pale yellow.  Keep the area around your vagina and rectum clean. ? Wash the area daily with warm water. ? Wipe yourself from front to back after using the toilet.  Keep all follow-up visits as told by your health care provider. This is important. How is this prevented?  Do not douche.  Wash the outside of your vagina with warm water only.  Use protection when having sex. This includes latex condoms and dental dams.  Limit how many sexual partners you have. To help prevent bacterial vaginosis, it is best to have sex with just one partner (monogamous).  Make sure you and your sexual partner are tested for STIs.  Wear cotton or cotton-lined  underwear.  Avoid wearing tight pants and pantyhose, especially during summer.  Limit the amount of alcohol that you drink.  Do not use any products that contain nicotine or tobacco, such as cigarettes and e-cigarettes. If you need help quitting, ask your health care provider.  Do not use illegal drugs. Where to find more information:  Centers for Disease Control and Prevention: AppraiserFraud.fi  American Sexual Health Association (ASHA): www.ashastd.org  [place] Department of Health and Financial controller, Office on Women's Health: DustingSprays.pl or SecuritiesCard.it Contact a  health care provider if:  Your symptoms do not improve, even after treatment.  You have more discharge or pain when urinating.  You have a fever.  You have pain in your abdomen.  You have pain during sex.  You have vaginal bleeding between periods. Summary  Bacterial vaginosis is a vaginal infection that occurs when the normal balance of bacteria in the vagina is disrupted.  Because bacterial vaginosis increases your risk for STIs (sexually transmitted infections), getting treated can help reduce your risk for chlamydia, gonorrhea, herpes, and HIV (human immunodeficiency virus). Treatment is also important for preventing complications in pregnant women, because the condition can cause an early (premature) delivery.  This condition is treated with antibiotic medicines. These may be given as a pill, a vaginal cream, or a medicine that is put into the vagina (suppository). This information is not intended to replace advice given to you by your health care provider. Make sure you discuss any questions you have with your health care provider. Document Released: 10/26/2005 Document Revised: 03/01/2017 Document Reviewed: 07/11/2016 Elsevier Interactive Patient Education  2018 [employer]. Oral Contraception Information Oral contraceptive pills (OCPs) are medicines taken to prevent pregnancy. OCPs work by preventing the ovaries from releasing eggs. The hormones in OCPs also cause the cervical mucus to thicken, preventing the sperm from entering the uterus. The hormones also cause the uterine lining to become thin, not allowing a fertilized egg to attach to the inside of the uterus. OCPs are highly effective when taken exactly as prescribed. However, OCPs do not prevent sexually transmitted diseases (STDs). Safe sex practices, such as using condoms along with the pill, can help prevent STDs. Before taking the pill, you may have a physical exam and Pap test. Your health care provider may order blood  tests. The health care provider will make sure you are a good candidate for oral contraception. Discuss with your health care provider the possible side effects of the OCP you may be prescribed. When starting an OCP, it can take 2 to 3 months for the body to adjust to the changes in hormone levels in your body. Types of oral contraception  The combination pill-This pill contains estrogen and progestin (synthetic progesterone) hormones. The combination pill comes in 21-day, 28-day, or 91-day packs. Some types of combination pills are meant to be taken continuously (365-day pills). With 21-day packs, you do not take pills for 7 days after the last pill. With 28-day packs, the pill is taken every day. The last 7 pills are without hormones. Certain types of pills have more than 21 hormone-containing pills. With 91-day packs, the first 84 pills contain both hormones, and the last 7 pills contain no hormones or contain estrogen only.  The minipill-This pill contains the progesterone hormone only. The pill is taken every day continuously. It is very important to take the pill at the same time each day. The minipill comes in packs of 28 pills. All 28 pills contain the hormone.  Advantages of oral contraceptive pills  Decreases premenstrual symptoms.  Treats menstrual period cramps.  Regulates the menstrual cycle.  Decreases a heavy menstrual flow.  May treatacne, depending on the type of pill.  Treats abnormal uterine bleeding.  Treats polycystic ovarian syndrome.  Treats endometriosis.  Can be used as emergency contraception. Things that can make oral contraceptive pills less effective OCPs can be less effective if:  You forget to take the pill at the same time every day.  You have a stomach or intestinal disease that lessens the absorption of the pill.  You take OCPs with other medicines that make OCPs less effective, such as antibiotics, certain HIV medicines, and some seizure  medicines.  You take expired OCPs.  You forget to restart the pill on day 7, when using the packs of 21 pills.  Risks associated with oral contraceptive pills Oral contraceptive pills can sometimes cause side effects, such as:  Headache.  Nausea.  Breast tenderness.  Irregular bleeding or spotting.  Combination pills are also associated with a small increased risk of:  Blood clots.  Heart attack.  Stroke.  This information is not intended to replace advice given to you by your health care provider. Make sure you discuss any questions you have with your health care provider. Document Released: 01/16/2003 Document Revised: 04/02/2016 Document Reviewed: 04/16/2013 Elsevier Interactive Patient Education  [employer].

## 2018-03-28 NOTE — Progress Notes (Signed)
GYNECOLOGY  VISIT   HPI: 27 y.o.   Single  African American  female   G0P0000 with No LMP recorded. Patient has had an injection.   here c/o vaginal itching and discharge, for the last few weeks. The d/c is white, creamy. Slight odor.  She c/o some vaginal dryness for the last 1-2 months. She didn't get her last depo-provera shot secondary to the vaginal dryness.  She has a h/o abnormal bleeding and terrible cramps. She is sexually active with a man, no other contraception. She thinks she has infertility.  She had nausea with OCP's, tried the nuvaring.   GYNECOLOGIC HISTORY: No LMP recorded. Patient has had an injection. Contraception:depo provera Menopausal hormone therapy: none         OB History    Gravida  0   Para  0   Term  0   Preterm  0   AB  0   Living  0     SAB  0   TAB  0   Ectopic  0   Multiple  0   Live Births  0              Patient Active Problem List   Diagnosis Date Noted  . Migraine without aura     Past Medical History:  Diagnosis Date  . Depression   . Hormone disorder   . Migraine without aura   . PCOS (polycystic ovarian syndrome)   . Vitamin D deficiency     Past Surgical History:  Procedure Laterality Date  . COLPOSCOPY      Current Outpatient Medications  Medication Sig Dispense Refill  . medroxyPROGESTERone (DEPO-PROVERA) 150 MG/ML injection Inject 150 mg into the muscle every 3 (three) months.     No current facility-administered medications for this visit.      ALLERGIES: E-mycin [erythromycin]  Family History  Problem Relation Age of Onset  . Fibroids Mother     Social History   Socioeconomic History  . Marital status: Single    Spouse name: Not on file  . Number of children: Not on file  . Years of education: Not on file  . Highest education level: Not on file  Occupational History  . Not on file  Social Needs  . Financial resource strain: Not on file  . Food insecurity:    Worry: Not on file   Inability: Not on file  . Transportation needs:    Medical: Not on file    Non-medical: Not on file  Tobacco Use  . Smoking status: Never Smoker  . Smokeless tobacco: Never Used  Substance and Sexual Activity  . Alcohol use: Yes    Alcohol/week: 4.2 oz    Types: 7 Standard drinks or equivalent per week  . Drug use: Yes    Types: Marijuana    Comment: smokes daily   . Sexual activity: Yes    Partners: Male    Birth control/protection: Injection    Comment: Depo Provera  Lifestyle  . Physical activity:    Days per week: Not on file    Minutes per session: Not on file  . Stress: Not on file  Relationships  . Social connections:    Talks on phone: Not on file    Gets together: Not on file    Attends religious service: Not on file    Active member of club or organization: Not on file    Attends meetings of clubs or organizations: Not on file  Relationship status: Not on file  . Intimate partner violence:    Fear of current or ex partner: Not on file    Emotionally abused: Not on file    Physically abused: Not on file    Forced sexual activity: Not on file  Other Topics Concern  . Not on file  Social History Narrative  . Not on file    Review of Systems  Constitutional: Negative.   HENT: Negative.   Eyes: Negative.   Respiratory: Negative.   Cardiovascular: Negative.   Gastrointestinal: Positive for abdominal pain and nausea.  Genitourinary:       Vaginal itching and discharge  Musculoskeletal: Negative.   Skin: Positive for itching and rash.       Rash on arms   Neurological: Negative.   Endo/Heme/Allergies: Negative.   Psychiatric/Behavioral: Negative.     PHYSICAL EXAMINATION:    BP 122/70 (BP Location: Right Arm, Patient Position: Sitting, Cuff Size: Normal)   Pulse 84   Resp 14   Wt 155 lb (70.3 kg)   BMI 26.19 kg/m     General appearance: alert, cooperative and appears stated age  Pelvic: External genitalia:  no lesions              Urethra:   normal appearing urethra with no masses, tenderness or lesions              Bartholins and Skenes: normal                 Vagina: normal appearing vagina with normal color. There is a slight increase in thick, white vaginal d/c              Cervix: no lesions   Chaperone was present for exam.  Wet prep: ++ clue, no trich, few wbc KOH: + yeast PH: 5.5   ASSESSMENT Bacterial vaginitis Yeast vaginitis Contraception, stopped her depo-provera (she thinks she is infertile, had unprotected intercourse for 7 years without a pregnancy) H/O AUB and severe dysmenorrhea    PLAN Treat BV Treat yeast with diflucan UPT negative Discussed the risk of ectopic with infertility Discussed the option of OCPs, nuvaring and Mirena IUD  She will consider OCP's, information given   An After Visit Summary was printed and given to the patient.

## 2018-04-06 ENCOUNTER — Encounter: Payer: Self-pay | Admitting: Obstetrics and Gynecology

## 2018-04-06 ENCOUNTER — Telehealth: Payer: Self-pay | Admitting: Obstetrics and Gynecology

## 2018-04-06 NOTE — Telephone Encounter (Addendum)
Patient said she is returning a call. No open telephone note. Patient states she was seen recently.   Billing sent a letter, routing to [place].

## 2018-06-13 NOTE — Progress Notes (Signed)
GYNECOLOGY  VISIT   HPI: 27 y.o.   Single  African American  female   G0P0000 with No LMP recorded (lmp unknown). (Menstrual status: Other). Patient has not had a menses since stopping Depo in January.    here for preconception consult. Prior to the depo-provera she had issues with menometrorrhagia and severe dysmenorrhea. She c/o weight gain and bloating with the depo-provera. Up 15 lbs since January.   H/O chlamydia   GYNECOLOGIC HISTORY: No LMP recorded (lmp unknown). (Menstrual status: Other). No menses since stopping Depo in January. Contraception: None Menopausal hormone therapy: None        OB History    Gravida  0   Para  0   Term  0   Preterm  0   AB  0   Living  0     SAB  0   TAB  0   Ectopic  0   Multiple  0   Live Births  0              Patient Active Problem List   Diagnosis Date Noted  . Migraine without aura     Past Medical History:  Diagnosis Date  . Depression   . Hormone disorder   . Migraine without aura   . PCOS (polycystic ovarian syndrome)   . Vitamin D deficiency     Past Surgical History:  Procedure Laterality Date  . COLPOSCOPY      No current outpatient medications on file.   No current facility-administered medications for this visit.      ALLERGIES: E-mycin [erythromycin]  Family History  Problem Relation Age of Onset  . Fibroids Mother     Social History   Socioeconomic History  . Marital status: Single    Spouse name: Not on file  . Number of children: Not on file  . Years of education: Not on file  . Highest education level: Not on file  Occupational History  . Not on file  Social Needs  . Financial resource strain: Not on file  . Food insecurity:    Worry: Not on file    Inability: Not on file  . Transportation needs:    Medical: Not on file    Non-medical: Not on file  Tobacco Use  . Smoking status: Never Smoker  . Smokeless tobacco: Never Used  Substance and Sexual Activity  . Alcohol  use: Yes    Alcohol/week: 4.2 oz    Types: 7 Standard drinks or equivalent per week  . Drug use: Yes    Types: Marijuana    Comment: smokes daily   . Sexual activity: Yes    Partners: Male    Birth control/protection: None  Lifestyle  . Physical activity:    Days per week: Not on file    Minutes per session: Not on file  . Stress: Not on file  Relationships  . Social connections:    Talks on phone: Not on file    Gets together: Not on file    Attends religious service: Not on file    Active member of club or organization: Not on file    Attends meetings of clubs or organizations: Not on file    Relationship status: Not on file  . Intimate partner violence:    Fear of current or ex partner: Not on file    Emotionally abused: Not on file    Physically abused: Not on file    Forced sexual activity: Not  on file  Other Topics Concern  . Not on file  Social History Narrative  . Not on file    Review of Systems  Constitutional: Negative.   HENT: Negative.   Eyes: Negative.   Respiratory: Negative.   Cardiovascular: Negative.   Gastrointestinal:       Intermittent abdominal cramping   Genitourinary:       Amenorrhea since stopping Depo in Jan  Musculoskeletal: Negative.   Skin: Negative.   Neurological: Negative.   Endo/Heme/Allergies: Negative.   Psychiatric/Behavioral: Negative.     PHYSICAL EXAMINATION:    BP 128/80 (BP Location: Right Arm, Patient Position: Sitting)   Pulse 76   Wt 165 lb 9.6 oz (75.1 kg)   LMP  (LMP Unknown)   BMI 27.99 kg/m     General appearance: alert, cooperative and appears stated age Neck: small tender lump posterior to her right ear (being evaluated), supple, symmetrical, trachea midline and thyroid normal to inspection and palpation Abdomen: soft, mildly tender in the RLQ, no rebound, no guarding. Mildly distended, no masses,  no organomegaly   ASSESSMENT Secondary amenorrhea, c/w depo-provera use. Last shot in 1/19. I discussed  with the patient that some women can have a delayed return in fertility for up to 12-18 months after depo-provera.  Weight gain, likely from the depo-provera, will check TSH Abdominal bloating Preconception counseling H/O chlamydia    PLAN UPT negative TSH Call with +UPT, discussed the importance of early evaluation and the risk of ectopic pregnancy Start PNV Given information on good health prior to pregnancy    An After Visit Summary was printed and given to the patient.  ~15 minutes face to face time of which over 50% was spent in counseling.

## 2018-06-14 ENCOUNTER — Ambulatory Visit (INDEPENDENT_AMBULATORY_CARE_PROVIDER_SITE_OTHER): Payer: BLUE CROSS/BLUE SHIELD | Admitting: Obstetrics and Gynecology

## 2018-06-14 ENCOUNTER — Encounter: Payer: Self-pay | Admitting: Obstetrics and Gynecology

## 2018-06-14 ENCOUNTER — Other Ambulatory Visit: Payer: Self-pay

## 2018-06-14 VITALS — BP 128/80 | HR 76 | Wt 165.6 lb

## 2018-06-14 DIAGNOSIS — Z3169 Encounter for other general counseling and advice on procreation: Secondary | ICD-10-CM | POA: Diagnosis not present

## 2018-06-14 DIAGNOSIS — N912 Amenorrhea, unspecified: Secondary | ICD-10-CM | POA: Diagnosis not present

## 2018-06-14 DIAGNOSIS — R635 Abnormal weight gain: Secondary | ICD-10-CM | POA: Diagnosis not present

## 2018-06-14 DIAGNOSIS — R140 Abdominal distension (gaseous): Secondary | ICD-10-CM | POA: Diagnosis not present

## 2018-06-14 DIAGNOSIS — Z8619 Personal history of other infectious and parasitic diseases: Secondary | ICD-10-CM

## 2018-06-14 LAB — POCT URINE PREGNANCY: Preg Test, Ur: NEGATIVE

## 2018-06-14 NOTE — Patient Instructions (Signed)
Preparing for Pregnancy If you are considering becoming pregnant, make an appointment to see your regular health care provider to learn how to prepare for a safe and healthy pregnancy (preconception care). During a preconception care visit, your health care provider will:  Do a complete physical exam, including a Pap test.  Take a complete medical history.  Give you information, answer your questions, and help you resolve problems.  Preconception checklist Medical history  Tell your health care provider about any current or past medical conditions. Your pregnancy or your ability to become pregnant may be affected by chronic conditions, such as diabetes, chronic hypertension, and thyroid problems.  Include your family's medical history as well as your partner's medical history.  Tell your health care provider about any history of STIs (sexually transmitted infections).These can affect your pregnancy. In some cases, they can be passed to your baby. Discuss any concerns that you have about STIs.  If indicated, discuss the benefits of genetic testing. This testing will show whether there are any genetic conditions that may be passed from you or your partner to your baby.  Tell your health care provider about: ? Any problems you have had with conception or pregnancy. ? Any medicines you take. These include vitamins, herbal supplements, and over-the-counter medicines. ? Your history of immunizations. Discuss any vaccinations that you may need.  Diet  Ask your health care provider what to include in a healthy diet that has a balance of nutrients. This is especially important when you are pregnant or preparing to become pregnant.  Ask your health care provider to help you reach a healthy weight before pregnancy. ? If you are overweight, you may be at higher risk for certain complications, such as high blood pressure, diabetes, and preterm birth. ? If you are underweight, you are more likely  to have a baby who has a low birth weight.  Lifestyle, work, and home  Let your health care provider know: ? About any lifestyle habits that you have, such as alcohol use, drug use, or smoking. ? About recreational activities that may put you at risk during pregnancy, such as downhill skiing and certain exercise programs. ? Tell your health care provider about any international travel, especially any travel to places with an active Zika virus outbreak. ? About harmful substances that you may be exposed to at work or at home. These include chemicals, pesticides, radiation, or even litter boxes. ? If you do not feel safe at home.  Mental health  Tell your health care provider about: ? Any history of mental health conditions, including feelings of depression, sadness, or anxiety. ? Any medicines that you take for a mental health condition. These include herbs and supplements.  Home instructions to prepare for pregnancy Lifestyle  Eat a balanced diet. This includes fresh fruits and vegetables, whole grains, lean meats, low-fat dairy products, healthy fats, and foods that are high in fiber. Ask to meet with a nutritionist or registered dietitian for assistance with meal planning and goals.  Get regular exercise. Try to be active for at least 30 minutes a day on most days of the week. Ask your health care provider which activities are safe during pregnancy.  Do not use any products that contain nicotine or tobacco, such as cigarettes and e-cigarettes. If you need help quitting, ask your health care provider.  Do not drink alcohol.  Do not take illegal drugs.  Maintain a healthy weight. Ask your health care provider what weight range is   right for you.  General instructions  Keep an accurate record of your menstrual periods. This makes it easier for your health care provider to determine your baby's due date.  Begin taking prenatal vitamins and folic acid supplements daily as directed by  your health care provider.  Manage any chronic conditions, such as high blood pressure and diabetes, as told by your health care provider. This is important.  How do I know that I am pregnant? You may be pregnant if you have been sexually active and you miss your period. Symptoms of early pregnancy include:  Mild cramping.  Very light vaginal bleeding (spotting).  Feeling unusually tired.  Nausea and vomiting (morning sickness).  If you have any of these symptoms and you suspect that you might be pregnant, you can take a home pregnancy test. These tests check for a hormone in your urine (human chorionic gonadotropin, or hCG). A woman's body begins to make this hormone during early pregnancy. These tests are very accurate. Wait until at least the first day after you miss your period to take one. If the test shows that you are pregnant (you get a positive result), call your health care provider to make an appointment for prenatal care. What should I do if I become pregnant?  Make an appointment with your health care provider as soon as you suspect you are pregnant.  Do not use any products that contain nicotine, such as cigarettes, chewing tobacco, and e-cigarettes. If you need help quitting, ask your health care provider.  Do not drink alcoholic beverages. Alcohol is related to a number of birth defects.  Avoid toxic odors and chemicals.  You may continue to have sexual intercourse if it does not cause pain or other problems, such as vaginal bleeding. This information is not intended to replace advice given to you by your health care provider. Make sure you discuss any questions you have with your health care provider. Document Released: 10/08/2008 Document Revised: 06/23/2016 Document Reviewed: 05/17/2016 Elsevier Interactive Patient Education  2018 Elsevier Inc.  

## 2018-06-15 LAB — TSH: TSH: 1.74 u[IU]/mL (ref 0.450–4.500)

## 2018-08-02 ENCOUNTER — Telehealth: Payer: Self-pay | Admitting: Obstetrics and Gynecology

## 2018-08-02 NOTE — Telephone Encounter (Signed)
Spoke with patient, advised per Dr. Talbert Nan. OV scheduled for 9/25 at 4:15pm with Dr. Talbert Nan.  Routing to provider for final review. Patient is agreeable to disposition. Will close encounter.

## 2018-08-02 NOTE — Telephone Encounter (Signed)
Spoke with patient. Patient reports lump in right breast is getting larger and wants it removed. Patient states lump has become larger in the last wk, is now uncomfortable to wear a bra, can't lay on her chest. Clear, bilateral nipple d/c. No skin changes.   Patient requesting referral for removal or imaging. Right breast US at [facility] on 06/01/17, patient request imaging at [facility], if needed.   Recommended OV for further evaluation. Patient declines OV today with Dr. Talbert Nan.  Patient states she has discussed this with Dr. Talbert Nan multiple times, "now I want something done, can't miss anymore work". Patient states she needs something done about this lump and discomfort today. ER precautions reviewed with patient for sever pain. Advised Dr. Talbert Nan is in the OR this morning, will review with her when she returns to the office and return call, patient agreeable.   Dr. Talbert Nan -please advise.

## 2018-08-02 NOTE — Telephone Encounter (Signed)
If she is having that much pain, she may have a mastitis and should be seen prior to imaging. This is not just simply that she has a pre-existing lump and wants it removed, she is now having enlargement and pain. If her pain is severe and she can't come in here she can go to the ER.

## 2018-08-02 NOTE — Progress Notes (Signed)
GYNECOLOGY  VISIT   HPI: 27 y.o.   Single Black or African American Not Hispanic or Latino  Female. The patient stopped depo provera in January.   [id number] with No LMP recorded. (Menstrual status: Irregular Periods).   here for right breast pain. 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 Last imaging in 7/18 it measured 1.3 cm. She started having pain in the area of the lump in the last 4 days, feels bigger. Not aware of erythema. No fever, having episodes of sweating.   GYNECOLOGIC HISTORY: No LMP recorded. (Menstrual status: Irregular Periods). Contraception:none Menopausal hormone therapy: none        OB History    Gravida  0   Para  0   Term  0   Preterm  0   AB  0   Living  0     SAB  0   TAB  0   Ectopic  0   Multiple  0   Live Births  0              Patient Active Problem List   Diagnosis Date Noted  . History of chlamydia   . Migraine without aura     Past Medical History:  Diagnosis Date  . Depression   . History of chlamydia   . Hormone disorder   . Migraine without aura   . PCOS (polycystic ovarian syndrome)   . Vitamin D deficiency     Past Surgical History:  Procedure Laterality Date  . COLPOSCOPY      No current outpatient medications on file.   No current facility-administered medications for this visit.      ALLERGIES: E-mycin [erythromycin]  Family History  Problem Relation Age of Onset  . Fibroids Mother     Social History   Socioeconomic History  . Marital status: Single    Spouse name: Not on file  . Number of children: Not on file  . Years of education: Not on file  . Highest education level: Not on file  Occupational History  . Not on file  Social Needs  . Financial resource strain: Not on file  . Food insecurity:    Worry: Not on file    Inability: Not on file  . Transportation needs:    Medical: Not on file    Non-medical: Not on file  Tobacco Use  . Smoking status:  Never Smoker  . Smokeless tobacco: Never Used  Substance and Sexual Activity  . Alcohol use: Yes    Alcohol/week: 7.0 standard drinks    Types: 7 Standard drinks or equivalent per week  . Drug use: Yes    Types: Marijuana    Comment: smokes daily   . Sexual activity: Yes    Partners: Male    Birth control/protection: None  Lifestyle  . Physical activity:    Days per week: Not on file    Minutes per session: Not on file  . Stress: Not on file  Relationships  . Social connections:    Talks on phone: Not on file    Gets together: Not on file    Attends religious service: Not on file    Active member of club or organization: Not on file    Attends meetings of clubs or organizations: Not on file    Relationship status: Not on file  . Intimate partner violence:    Fear of current or ex partner: Not on file  Emotionally abused: Not on file    Physically abused: Not on file    Forced sexual activity: Not on file  Other Topics Concern  . Not on file  Social History Narrative  . Not on file    Review of Systems  Constitutional: Positive for chills and fever.  HENT: Negative.   Eyes: Negative.   Respiratory: Negative.   Cardiovascular: Negative.   Gastrointestinal: Positive for abdominal distention, abdominal pain, nausea and vomiting.  Endocrine: Positive for cold intolerance and heat intolerance.  Genitourinary: Positive for frequency, menstrual problem, urgency and vaginal discharge.       Breast pain  Musculoskeletal: Negative.   Skin: Negative.   Allergic/Immunologic: Negative.   Neurological: Negative.   Hematological: Negative.   Psychiatric/Behavioral: Negative.   All other systems reviewed and are negative.   PHYSICAL EXAMINATION:    Ht 5' 4.5" (1.638 m)   BMI 27.99 kg/m     General appearance: alert, cooperative and appears stated age Breasts: bilaterally very tender breasts, no erythema. stable lump in right breast at 6 o'clock, ~3 cm from the areolar  region. Feels <1 cm, smooth, tender and mobile. She has a 2 cm not tender lump in her right axilla. 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 Right axillary lump, was present last year, feels larger on exam today Patient feels it has enlarged. Tender, but she has bilateral, diffuse breast tenderness.  No menses since Depo-provera in 1/19, recently had light spotting.     PLAN I think her tenderness is her hormonal, suspect she will start cycling again soon UPT negative Will set her up for right breast and axillary ultrasound   An After Visit Summary was printed and given to the patient.

## 2018-08-02 NOTE — Telephone Encounter (Signed)
Patient called requesting an imaging referral for a lump in her right breast. She said she's seen Dr. Talbert Nan about this before and the problem is getting worse. Patient gave the following imaging center:  [facility] on [address].  Last seen: 06/14/18

## 2018-08-03 ENCOUNTER — Other Ambulatory Visit: Payer: Self-pay

## 2018-08-03 ENCOUNTER — Encounter: Payer: Self-pay | Admitting: Obstetrics and Gynecology

## 2018-08-03 ENCOUNTER — Ambulatory Visit: Payer: BLUE CROSS/BLUE SHIELD | Admitting: Obstetrics and Gynecology

## 2018-08-03 VITALS — BP 124/82 | Temp 98.5°F | Ht 64.5 in | Wt 164.0 lb

## 2018-08-03 DIAGNOSIS — N6315 Unspecified lump in the right breast, overlapping quadrants: Secondary | ICD-10-CM

## 2018-08-03 DIAGNOSIS — N644 Mastodynia: Secondary | ICD-10-CM

## 2018-08-03 DIAGNOSIS — N6310 Unspecified lump in the right breast, unspecified quadrant: Secondary | ICD-10-CM

## 2018-08-03 DIAGNOSIS — R590 Localized enlarged lymph nodes: Secondary | ICD-10-CM | POA: Diagnosis not present

## 2018-08-04 ENCOUNTER — Telehealth: Payer: Self-pay | Admitting: *Deleted

## 2018-08-04 ENCOUNTER — Other Ambulatory Visit: Payer: Self-pay | Admitting: Obstetrics and Gynecology

## 2018-08-04 DIAGNOSIS — N6314 Unspecified lump in the right breast, lower inner quadrant: Secondary | ICD-10-CM | POA: Diagnosis not present

## 2018-08-04 DIAGNOSIS — N644 Mastodynia: Secondary | ICD-10-CM

## 2018-08-04 DIAGNOSIS — N6310 Unspecified lump in the right breast, unspecified quadrant: Principal | ICD-10-CM

## 2018-08-04 DIAGNOSIS — N6313 Unspecified lump in the right breast, lower outer quadrant: Secondary | ICD-10-CM | POA: Diagnosis not present

## 2018-08-04 DIAGNOSIS — R590 Localized enlarged lymph nodes: Secondary | ICD-10-CM

## 2018-08-04 DIAGNOSIS — N6315 Unspecified lump in the right breast, overlapping quadrants: Secondary | ICD-10-CM

## 2018-08-04 DIAGNOSIS — N6001 Solitary cyst of right breast: Secondary | ICD-10-CM

## 2018-08-04 NOTE — Telephone Encounter (Signed)
Patient seen in office on 9/25, patient requested to be contacted directly to be scheduled. Patients last breast US at [facility], request to schedule at [place].  Patient aware order will be placed, [facility] will contact her to schedule.    Spoke with Tye Maryland at [facility]. Confirmed order placed for right breast US will include axilla. She will contact patient to schedule.

## 2018-08-04 NOTE — Telephone Encounter (Signed)
Patient in office. Copy of [facility] order given to patient. Patient states she is scheduled at [facility] today at 2:30pm.    Copy of order faxed to [facility].   Routing to provider for final review. Patient is agreeable to disposition. Will close encounter.

## 2018-08-04 NOTE — Telephone Encounter (Signed)
Call placed to [facility]. Right breast US scheduled for 08/11/18 at 8:45am.    Call returned to patient, advised of appointment as seen above. Patient expressed frustration. Patient states she does not want imaging, is unable to work due to pain, patient demanding removal of "breast lump in 10 days". Advised patient last imaging 07/2017, recommendations will be made after imaging completed. Offered to return call to [facility] for earlier appt, no response from patient. Patient requesting return call from Dr. Nelson Chimes directly. Advised I will review concerns with Dr. Nelson Chimes and return call. Patient disconnected call.   Routing to Dr. Talbert Nan.   CLamont Snowball, RN

## 2018-08-04 NOTE — Telephone Encounter (Signed)
Patient was called for an appointment with the breast center but it is 2 weeks away. Would like to get in sooner if possible somewhere else.

## 2018-08-04 NOTE — Telephone Encounter (Signed)
Call transferred from front office staff. Patient states she has called [facility] and has an appointment scheduled for today, requesting to pick up copy of imaging order to take with her. Patient states Anita [name] does have a cancellation, no appointment scheduled, needs order. Offered assistance again with scheduling, no response from patient.   Advised patient order will be available after 1pm today for pick up at front office.

## 2018-08-10 ENCOUNTER — Other Ambulatory Visit: Payer: Self-pay | Admitting: Radiology

## 2018-08-10 DIAGNOSIS — D241 Benign neoplasm of right breast: Secondary | ICD-10-CM | POA: Diagnosis not present

## 2018-08-10 DIAGNOSIS — D360 Benign neoplasm of lymph nodes: Secondary | ICD-10-CM | POA: Diagnosis not present

## 2018-08-10 DIAGNOSIS — N6011 Diffuse cystic mastopathy of right breast: Secondary | ICD-10-CM | POA: Diagnosis not present

## 2018-08-31 ENCOUNTER — Telehealth: Payer: Self-pay | Admitting: *Deleted

## 2018-08-31 DIAGNOSIS — R928 Other abnormal and inconclusive findings on diagnostic imaging of breast: Secondary | ICD-10-CM | POA: Diagnosis not present

## 2018-08-31 NOTE — Telephone Encounter (Signed)
Anita [name] called from [place]. Patient seen at [facility] on 08/10/18 for right breast biopsy;  benign fibroadenoma and benign lymph node. Patient was offered referral to surgeon because of right breast pain. Patient is scheduled with Dr. Dalbert Batman at [facility] on 09/07/18.   Patient returned call to [facility] this week with c/o of severe right breast pain, patient returned to [facility] today for right breast ultrasound. 2 enlarged right axillary nodes noted. Report faxed with recommendations for Dr. Talbert Nan to consider abx.   Advised will await [facility] report and review with Dr. Talbert Nan.   Report received and to Dr. Talbert Nan for review.

## 2018-08-31 NOTE — Telephone Encounter (Signed)
Left detailed message to call Sharee Pimple, RN at [place]. Reviewed with Dr. Talbert Nan. Patient to f/u with [facility] for evaluation and tx. If acute infection, should be seen earlier.

## 2018-09-01 NOTE — Telephone Encounter (Signed)
Spoke with patient. Advised Dr. Talbert Nan  received [facility] report dated 08/31/18 for right breast US. Instructed patient to f/u with Dr. Dalbert Batman as scheduled or PCP.   Patient asking if abx were called in? Advised patient she will need to f/u with Dr. Dalbert Batman or PCP for treatment.   Patient verbalizes understanding.  Routing to provider for final review. Patient is agreeable to disposition. Will close encounter.

## 2018-09-01 NOTE — Telephone Encounter (Signed)
Spoke with Janett Billow at [place]. Patient has not been notified of Dr. Gentry Fitz recommendations. Janett Billow states Dr. Luan Pulling discussed plan of care with patient in detail at visit on 10/23, patient to f/u with Dr. Dalbert Batman at [place].

## 2018-09-03 ENCOUNTER — Encounter (HOSPITAL_COMMUNITY): Payer: Self-pay | Admitting: Emergency Medicine

## 2018-09-03 ENCOUNTER — Other Ambulatory Visit: Payer: Self-pay

## 2018-09-03 ENCOUNTER — Ambulatory Visit (HOSPITAL_COMMUNITY)
Admission: EM | Admit: 2018-09-03 | Discharge: 2018-09-03 | Disposition: A | Discharge status: Home / Self Care | Payer: BLUE CROSS/BLUE SHIELD | Attending: Family Medicine | Admitting: Family Medicine

## 2018-09-03 DIAGNOSIS — N644 Mastodynia: Secondary | ICD-10-CM | POA: Diagnosis not present

## 2018-09-03 MED ORDER — HYDROCODONE-ACETAMINOPHEN 5-325 MG PO TABS: 1.0000 | ORAL_TABLET | Freq: Four times a day (QID) | ORAL | 0 refills | Status: DC | PRN

## 2018-09-03 MED ORDER — MELOXICAM 7.5 MG PO TABS: 7.5000 mg | ORAL_TABLET | Freq: Every day | ORAL | 0 refills | Status: DC

## 2018-09-03 NOTE — Discharge Instructions (Addendum)
Start Mobic. Do not take ibuprofen (motrin/advil)/ naproxen (aleve) while on mobic. You can take norco for breakthrough pain. Norco has tylenol in it, make sure not take take more than 3g of tylenol in one day. Please call your surgeon on Monday for a sooner appointment.

## 2018-09-03 NOTE — ED Provider Notes (Signed)
[facility]    CSN: [id number] Arrival date & time: 09/03/18  1022     History   Chief Complaint Chief Complaint  Patient presents with  . Mass    HPI Anita [name] is a 27 y.o. female.   27 year old female comes in for right breast pain. Had biopsy and was told it is benign, referred to surgery due to continued pain. Patient states pain has increased without relief of tylenol and motrin and would like further pain control. She states she has noticed area to be more swollen, and worries that her "breast is going to fall off". She denies spreading erythema, warmth, fever. Has appointment with surgery on 10/30.      Past Medical History:  Diagnosis Date  . Depression   . History of chlamydia   . Hormone disorder   . Migraine without aura   . PCOS (polycystic ovarian syndrome)   . Vitamin D deficiency     Patient Active Problem List   Diagnosis Date Noted  . History of chlamydia   . Migraine without aura     Past Surgical History:  Procedure Laterality Date  . COLPOSCOPY      OB History    Gravida  0   Para  0   Term  0   Preterm  0   AB  0   Living  0     SAB  0   TAB  0   Ectopic  0   Multiple  0   Live Births  0            Home Medications    Prior to Admission medications   Medication Sig Start Date End Date Taking? Authorizing Provider  HYDROcodone-acetaminophen (NORCO/VICODIN) 5-325 MG tablet Take 1 tablet by mouth every 6 (six) hours as needed. 09/03/18   Tasia Catchings, Maezie Justin V, PA-C  meloxicam (MOBIC) 7.5 MG tablet Take 1 tablet (7.5 mg total) by mouth daily. 09/03/18   Ok Edwards, PA-C    Family History Family History  Problem Relation Age of Onset  . Fibroids Mother     Social History Social History   Tobacco Use  . Smoking status: Never Smoker  . Smokeless tobacco: Never Used  Substance Use Topics  . Alcohol use: Yes    Alcohol/week: 7.0 standard drinks    Types: 7 Standard drinks or equivalent per week  . Drug  use: Yes    Types: Marijuana    Comment: smokes daily      Allergies   E-mycin [erythromycin]   Review of Systems Review of Systems  Reason unable to perform ROS: See HPI as above.     Physical Exam Triage Vital Signs ED Triage Vitals  Enc Vitals Group     BP 09/03/18 1144 130/85     Pulse Rate 09/03/18 1144 80     Resp 09/03/18 1144 16     Temp 09/03/18 1144 98.4 F (36.9 C)     Temp Source 09/03/18 1144 Oral     SpO2 09/03/18 1144 100 %     Weight --      Height --      Head Circumference --      Peak Flow --      Pain Score 09/03/18 1151 10     Pain Loc --      Pain Edu? --      Excl. in [place]? --    No data found.  Updated Vital  Signs BP 130/85 (BP Location: Left Arm)   Pulse 80   Temp 98.4 F (36.9 C) (Oral)   Resp 16   SpO2 100%   Physical Exam  Constitutional: She is oriented to person, place, and time. She appears well-developed and well-nourished. No distress.  Tearful   HENT:  Head: Normocephalic and atraumatic.  Eyes: Pupils are equal, round, and reactive to light. Conjunctivae are normal.  Cardiovascular: Normal rate and regular rhythm. Exam reveals no gallop and no friction rub.  No murmur heard. Pulmonary/Chest: Effort normal and breath sounds normal. No stridor. No respiratory distress. She has no wheezes. She has no rales.  Exam limited due to patient's pain/cooperation. No obvious swelling, erythema, warmth to the right breast/lateral rib area. Patient tender to light touch. Decreased ROM of shoulder due to pain. Normal grip strength. Sensation intact and equal. Radial pulse 2+, cap refill <2s  Neurological: She is alert and oriented to person, place, and time.  Skin: She is not diaphoretic.     UC Treatments / Results  Labs (all labs ordered are listed, but only abnormal results are displayed) Labs Reviewed - No data to display  EKG None  Radiology No results found.  Procedures Procedures (including critical care  time)  Medications Ordered in UC Medications - No data to display  Initial Impression / Assessment and Plan / UC Course  I have reviewed the triage vital signs and the nursing notes.  Pertinent labs & imaging results that were available during my care of the patient were reviewed by me and considered in my medical decision making (see chart for details).    Patient became tearful during exam, stating she wants to mass removed "right now" and cannot wait till appointment. Discussed with patient will have to call surgery on Monday for earlier appointment. Patient enquired on going to the emergency department for treatment. Discussed with patient emergency department also will not be able to remove mass today, and as procedure is not an emergency, will most likely have patient follow up with surgery as well. Patient expressed understanding. Will provide mobic and norco for pain control. Patient to contact surgery on Monday for further evaluation needed.  Final Clinical Impressions(s) / UC Diagnoses   Final diagnoses:  Breast pain, right    ED Prescriptions    Medication Sig Dispense Auth. Provider   HYDROcodone-acetaminophen (NORCO/VICODIN) 5-325 MG tablet Take 1 tablet by mouth every 6 (six) hours as needed. 10 tablet Ndea Kilroy V, PA-C   meloxicam (MOBIC) 7.5 MG tablet Take 1 tablet (7.5 mg total) by mouth daily. 15 tablet Ok Edwards, PA-C     Controlled Substance Prescriptions [place] Controlled Substance Registry consulted? Yes, I have consulted the [place] Controlled Substances Registry for this patient, and feel the risk/benefit ratio today is favorable for proceeding with this prescription for a controlled substance.   Ok Edwards, PA-C 09/03/18 1245

## 2018-09-03 NOTE — ED Triage Notes (Signed)
The patient presented to the [facility] with a complaint of a "mass" under her right arm and breast. The patient reported that she has already had a biopsy, ultrasound and mammogram. The patient reported that she has an appointment with a surgeon. The patient presented to the [facility] today for pain control.

## 2018-09-06 ENCOUNTER — Other Ambulatory Visit: Payer: Self-pay | Admitting: Surgery

## 2018-09-06 DIAGNOSIS — D241 Benign neoplasm of right breast: Secondary | ICD-10-CM | POA: Diagnosis not present

## 2018-09-09 ENCOUNTER — Other Ambulatory Visit: Payer: Self-pay | Admitting: Surgery

## 2018-09-09 DIAGNOSIS — D241 Benign neoplasm of right breast: Secondary | ICD-10-CM | POA: Diagnosis not present

## 2018-09-11 ENCOUNTER — Emergency Department (HOSPITAL_COMMUNITY): Payer: BLUE CROSS/BLUE SHIELD

## 2018-09-11 ENCOUNTER — Encounter (HOSPITAL_COMMUNITY): Payer: Self-pay | Admitting: Emergency Medicine

## 2018-09-11 ENCOUNTER — Emergency Department (HOSPITAL_COMMUNITY)
Admission: EM | Admit: 2018-09-11 | Discharge: 2018-09-11 | Disposition: A | Discharge status: Home / Self Care | Payer: BLUE CROSS/BLUE SHIELD | Attending: Emergency Medicine | Admitting: Emergency Medicine

## 2018-09-11 DIAGNOSIS — R1030 Lower abdominal pain, unspecified: Secondary | ICD-10-CM | POA: Diagnosis present

## 2018-09-11 DIAGNOSIS — R112 Nausea with vomiting, unspecified: Secondary | ICD-10-CM | POA: Diagnosis not present

## 2018-09-11 DIAGNOSIS — R110 Nausea: Secondary | ICD-10-CM | POA: Diagnosis not present

## 2018-09-11 DIAGNOSIS — R1110 Vomiting, unspecified: Secondary | ICD-10-CM | POA: Diagnosis not present

## 2018-09-11 DIAGNOSIS — F5089 Other specified eating disorder: Secondary | ICD-10-CM

## 2018-09-11 DIAGNOSIS — K529 Noninfective gastroenteritis and colitis, unspecified: Secondary | ICD-10-CM | POA: Diagnosis not present

## 2018-09-11 DIAGNOSIS — R1084 Generalized abdominal pain: Secondary | ICD-10-CM | POA: Diagnosis not present

## 2018-09-11 LAB — COMPREHENSIVE METABOLIC PANEL
ALT: 37 U/L (ref 0–44)
AST: 27 U/L (ref 15–41)
Albumin: 4.3 g/dL (ref 3.5–5.0)
Alkaline Phosphatase: 86 U/L (ref 38–126)
Anion gap: 11 (ref 5–15)
BUN: 16 mg/dL (ref 6–20)
CO2: 22 mmol/L (ref 22–32)
Calcium: 9.8 mg/dL (ref 8.9–10.3)
Chloride: 105 mmol/L (ref 98–111)
Creatinine, Ser: 0.89 mg/dL (ref 0.44–1.00)
GFR calc Af Amer: >60 mL/min (ref >60)
GFR calc non Af Amer: >60 mL/min (ref >60)
Glucose, Bld: 134 mg/dL — ABNORMAL HIGH (ref 70–99)
Potassium: 3.2 mmol/L — ABNORMAL LOW (ref 3.5–5.1)
Sodium: 138 mmol/L (ref 135–145)
Total Bilirubin: 0.7 mg/dL (ref 0.3–1.2)
Total Protein: 7.9 g/dL (ref 6.5–8.1)

## 2018-09-11 LAB — URINALYSIS, ROUTINE W REFLEX MICROSCOPIC
Bilirubin Urine: NEGATIVE
Glucose, UA: NEGATIVE mg/dL
Ketones, ur: 80 mg/dL — AB
Leukocytes, UA: NEGATIVE
Nitrite: NEGATIVE
Protein, ur: NEGATIVE mg/dL
Specific Gravity, Urine: >1.046 — ABNORMAL HIGH (ref 1.005–1.030)
pH: 6 (ref 5.0–8.0)

## 2018-09-11 LAB — CBC
HCT: 45.1 % (ref 36.0–46.0)
Hemoglobin: 14.3 g/dL (ref 12.0–15.0)
MCH: 29.5 pg (ref 26.0–34.0)
MCHC: 31.7 g/dL (ref 30.0–36.0)
MCV: 93 fL (ref 80.0–100.0)
Platelets: 308 10*3/uL (ref 150–400)
RBC: 4.85 MIL/uL (ref 3.87–5.11)
RDW: 11.5 % (ref 11.5–15.5)
WBC: 12.6 10*3/uL — ABNORMAL HIGH (ref 4.0–10.5)
nRBC: 0 % (ref 0.0–0.2)

## 2018-09-11 LAB — I-STAT BETA HCG BLOOD, ED (MC, WL, AP ONLY): I-stat hCG, quantitative: <5 m[IU]/mL (ref <5)

## 2018-09-11 LAB — LIPASE, BLOOD: Lipase: 30 U/L (ref 11–51)

## 2018-09-11 MED ORDER — SODIUM CHLORIDE 0.9 % IV BOLUS
1000.0000 mL | Freq: Once | INTRAVENOUS | Status: AC
  Administered 2018-09-11: 1000 mL via INTRAVENOUS

## 2018-09-11 MED ORDER — PROMETHAZINE HCL 25 MG PO TABS: 25.0000 mg | ORAL_TABLET | Freq: Four times a day (QID) | ORAL | 0 refills | Status: DC | PRN

## 2018-09-11 MED ORDER — ONDANSETRON 4 MG PO TBDP
4.0000 mg | ORAL_TABLET | Freq: Once | ORAL | Status: AC | PRN
  Administered 2018-09-11: 4 mg via ORAL
  Filled 2018-09-11: qty 1

## 2018-09-11 MED ORDER — ONDANSETRON HCL 4 MG/2ML IJ SOLN
4.0000 mg | Freq: Once | INTRAMUSCULAR | Status: AC
  Administered 2018-09-11: 4 mg via INTRAVENOUS
  Filled 2018-09-11: qty 2

## 2018-09-11 MED ORDER — PROMETHAZINE HCL 25 MG/ML IJ SOLN
25.0000 mg | Freq: Once | INTRAMUSCULAR | Status: AC
  Administered 2018-09-11: 25 mg via INTRAVENOUS
  Filled 2018-09-11: qty 1

## 2018-09-11 MED ORDER — FENTANYL CITRATE (PF) 100 MCG/2ML IJ SOLN
25.0000 ug | Freq: Once | INTRAMUSCULAR | Status: AC
  Administered 2018-09-11: 25 ug via INTRAVENOUS
  Filled 2018-09-11: qty 2

## 2018-09-11 MED ORDER — ONDANSETRON HCL 4 MG PO TABS: 4.0000 mg | ORAL_TABLET | Freq: Four times a day (QID) | ORAL | 0 refills | Status: AC

## 2018-09-11 MED ORDER — IOHEXOL 300 MG/ML  SOLN
100.0000 mL | Freq: Once | INTRAMUSCULAR | Status: AC | PRN
  Administered 2018-09-11: 100 mL via INTRAVENOUS

## 2018-09-11 NOTE — ED Triage Notes (Signed)
Pt here for eval of abdominal pain, nausea vomiting for 3 days. Actively vomiting. LMP unknown. Pain is generalized.

## 2018-09-11 NOTE — ED Notes (Signed)
Patient verbalizes understanding of medications and discharge instructions. No further questions at this time. VSS and patient ambulatory at discharge.   

## 2018-09-11 NOTE — ED Provider Notes (Signed)
[facility] EMERGENCY DEPARTMENT Provider Note   CSN: [id number] Arrival date & time: 09/11/18  1308     History   Chief Complaint Chief Complaint  Patient presents with  . Abdominal Pain  . Emesis    HPI Anita [name] is a 27 y.o. female.  27 y.o female with a PMH of depression, PCOS, Migraine with aura presents with abdominal pain x 3 days. Patient reports her pain is located on the lower abdomen and describes it as sharp with no radiation. She also reports some nausea, and multiple episodes of vomiting. She has tried zofran, zantac but reports no relieve in symptoms. She does reports mariajuana and cigarette use. She denies any fever, diarrhea, chest pain or shortness of breath or urinary symptoms.      Past Medical History:  Diagnosis Date  . Depression   . History of chlamydia   . Hormone disorder   . Migraine without aura   . PCOS (polycystic ovarian syndrome)   . Vitamin D deficiency     Patient Active Problem List   Diagnosis Date Noted  . History of chlamydia   . Migraine without aura     Past Surgical History:  Procedure Laterality Date  . COLPOSCOPY       OB History    Gravida  0   Para  0   Term  0   Preterm  0   AB  0   Living  0     SAB  0   TAB  0   Ectopic  0   Multiple  0   Live Births  0            Home Medications    Prior to Admission medications   Medication Sig Start Date End Date Taking? Authorizing Provider  doxycycline (VIBRA-TABS) 100 MG tablet Take 100 mg by mouth 2 (two) times daily. 09/06/18  Yes [provider]  oxycodone (OXY-IR) 5 MG capsule Take 5 mg by mouth every 6 (six) hours as needed for pain. 09/06/18  Yes [provider]  HYDROcodone-acetaminophen (NORCO/VICODIN) 5-325 MG tablet Take 1 tablet by mouth every 6 (six) hours as needed. Patient not taking: Reported on 09/11/2018 09/03/18   Ok Edwards, PA-C  meloxicam (MOBIC) 7.5 MG tablet Take 1 tablet (7.5 mg total)  by mouth daily. Patient not taking: Reported on 09/11/2018 09/03/18   Ok Edwards, PA-C  ondansetron (ZOFRAN) 4 MG tablet Take 1 tablet (4 mg total) by mouth every 6 (six) hours for 7 days. 09/11/18 09/18/18  Janeece Fitting, PA-C    Family History Family History  Problem Relation Age of Onset  . Fibroids Mother     Social History Social History   Tobacco Use  . Smoking status: Never Smoker  . Smokeless tobacco: Never Used  Substance Use Topics  . Alcohol use: Yes    Alcohol/week: 7.0 standard drinks    Types: 7 Standard drinks or equivalent per week  . Drug use: Yes    Types: Marijuana    Comment: smokes daily      Allergies   E-mycin [erythromycin]   Review of Systems Review of Systems  Constitutional: Negative for chills and fever.  HENT: Negative for sore throat.   Respiratory: Negative for shortness of breath.   Cardiovascular: Negative for chest pain.  Gastrointestinal: Positive for abdominal pain, nausea and vomiting. Negative for diarrhea.  Genitourinary: Negative for flank pain.  Musculoskeletal: Negative for back pain.  Skin:  Negative for pallor and wound.  Neurological: Negative for headaches.     Physical Exam Updated Vital Signs BP 132/81 (BP Location: Right Arm)   Pulse 60   Temp (!) 97.5 F (36.4 C) (Oral)   Resp 12   SpO2 100%   Physical Exam  Constitutional: She is oriented to person, place, and time. She appears well-developed and well-nourished. She appears toxic.  HENT:  Head: Normocephalic and atraumatic.  Cardiovascular: Normal rate.  Pulmonary/Chest: Effort normal and breath sounds normal. She has no wheezes. She has no rales.  Abdominal: Normal appearance and bowel sounds are normal. There is generalized tenderness. There is no rigidity, no guarding, no CVA tenderness and negative Murphy's sign.  Neurological: She is alert and oriented to person, place, and time.  Skin: Skin is warm and dry.  Nursing note and vitals reviewed.    ED  Treatments / Results  Labs (all labs ordered are listed, but only abnormal results are displayed) Labs Reviewed  COMPREHENSIVE METABOLIC PANEL - Abnormal; Notable for the following components:      Result Value   Potassium 3.2 (*)    Glucose, Bld 134 (*)    All other components within normal limits  CBC - Abnormal; Notable for the following components:   WBC 12.6 (*)    All other components within normal limits  LIPASE, BLOOD  URINALYSIS, ROUTINE W REFLEX MICROSCOPIC  I-STAT BETA HCG BLOOD, ED ([facility], [facility], [facility] ONLY)    EKG None  Radiology No results found.  Procedures Procedures (including critical care time)  Medications Ordered in ED Medications  ondansetron (ZOFRAN-ODT) disintegrating tablet 4 mg (4 mg Oral Given 09/11/18 1333)  sodium chloride 0.9 % bolus 1,000 mL (1,000 mLs Intravenous New Bag/Given 09/11/18 1423)  ondansetron (ZOFRAN) injection 4 mg (4 mg Intravenous Given 09/11/18 1423)  fentaNYL (SUBLIMAZE) injection 25 mcg (25 mcg Intravenous Given 09/11/18 1441)  iohexol (OMNIPAQUE) 300 MG/ML solution 100 mL (100 mLs Intravenous Contrast Given 09/11/18 1547)     Initial Impression / Assessment and Plan / ED Course  I have reviewed the triage vital signs and the nursing notes.  Pertinent labs & imaging results that were available during my care of the patient were reviewed by me and considered in my medical decision making (see chart for details).    Patient presents with abdominal pain x 3 days with multiple episodes of emesis. No fever, diarrhea.CMP showed slight decrease in potassium at 3.2, creatine is stable at 0.89. CBC showed showed slight leukocytosis at 12.6, patient is actively vomiting during my evaluation complaining of uncontrollable pain with tenderness along all abdomen will order CT abdomen to r/o any acute pathology.  Lipase within normal limits.  4:11 PM Care transferred to [facility] at shift change pending CT results and PO challenge. Rx for zofran  done for discharge.   Final Clinical Impressions(s) / ED Diagnoses   Final diagnoses:  Psychogenic vomiting with nausea    ED Discharge Orders         Ordered    ondansetron (ZOFRAN) 4 MG tablet  Every 6 hours     09/11/18 1610           Janeece Fitting, PA-C 09/11/18 1612    Davonna Belling, MD 09/14/18 [phone]

## 2018-09-11 NOTE — Discharge Instructions (Addendum)
I have provided a some Phenergan to help with your nausea please take as needed. Follow up with your primary care physician as needed.

## 2018-09-16 ENCOUNTER — Encounter (HOSPITAL_BASED_OUTPATIENT_CLINIC_OR_DEPARTMENT_OTHER): Payer: Self-pay | Admitting: *Deleted

## 2018-09-16 ENCOUNTER — Other Ambulatory Visit: Payer: Self-pay

## 2018-09-16 NOTE — Progress Notes (Signed)
Pt states she has been to the ER for vomiting since starting Doxycyline. Last fever was Sunday and pt states she is feeling some better, taking phenergan. Encouraged pt to revisit MD if symptoms continue or she becomes febrile again.

## 2018-09-22 ENCOUNTER — Encounter (HOSPITAL_BASED_OUTPATIENT_CLINIC_OR_DEPARTMENT_OTHER)
Admission: RE | Admit: 2018-09-22 | Discharge: 2018-09-22 | Disposition: A | Discharge status: Home / Self Care | Payer: BLUE CROSS/BLUE SHIELD | Source: Ambulatory Visit | Attending: Orthopaedic Surgery | Admitting: Orthopaedic Surgery

## 2018-09-22 DIAGNOSIS — Z01812 Encounter for preprocedural laboratory examination: Secondary | ICD-10-CM | POA: Insufficient documentation

## 2018-09-22 LAB — POCT PREGNANCY, URINE: Preg Test, Ur: NEGATIVE

## 2018-09-22 NOTE — H&P (Signed)
Anita [name] Documented: 09/06/2018 2:40 PM Location: [facility] Patient #: [id number] DOB: 28-Feb-1991 Single / Language: Anita [name] / Race: Black or African American Female   History of Present Illness (Kunaal Walkins A. Ninfa Linden MD; 09/06/2018 3:08 PM) The patient is a 27 year old female who presents with a complaint of Breast problems. This patient is referred by Dr. 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 Because of increase in size and enlarged lymph node in the axilla, she underwent a stereo guided biopsy of the breast mass as well as a lymph node in the axilla. Since then, she has been having increasing swelling, erythema, and pain in the right axilla. She has also had fevers. She reports abreast of been doing well. There is no family history of breast cancer and she is otherwise healthy   Past Surgical History Malachi Bonds, CMA; 09/06/2018 2:40 PM) Breast Biopsy  Right. Oral Surgery   Diagnostic Studies History Malachi Bonds, CMA; 09/06/2018 2:40 PM) Mammogram  within last year  Allergies Malachi Bonds, CMA; 09/06/2018 2:41 PM) Erythromycin *MACROLIDES*   Medication History Malachi Bonds, CMA; 09/06/2018 2:41 PM) Meloxicam (7.5MG  Tablet, Oral) Active. HYDROcodone-Acetaminophen (5-325MG  Tablet, Oral) Active. Medications Reconciled  Social History Malachi Bonds, CMA; 09/06/2018 2:40 PM) Caffeine use  Tea. Illicit drug use  Prefer to discuss with provider. Tobacco use  Current every day smoker.  Pregnancy / Birth History Malachi Bonds, CMA; 09/06/2018 2:40 PM) Durenda Age  0 Irregular periods  Para  0  Other Problems Malachi Bonds, CMA; 09/06/2018 2:40 PM) Arthritis   Vitals (Chemira Jones CMA; 09/06/2018 2:41 PM) 09/06/2018 2:40 PM Weight: 163.6 lb Height: 64in Body Surface Area: 1.8 m Body Mass Index: 28.08 kg/m  BP: 110/70 (Sitting, Left Arm, Standard)       Physical Exam  (Adaiah Jaskot A. Ninfa Linden MD; 09/06/2018 3:08 PM) The physical exam findings are as follows: Note:On exam, there is a small  mass at the 6:00 to 7 o'clock position of the right breast. There is swelling, tenderness, and erythema of the right axilla which is moderate to severe Lungs clear CV RRR Abdomen soft, NT    Assessment & Plan (Little Winton A. Ninfa Linden MD; 09/06/2018 3:09 PM) Rodman Comp, RIGHT (D24.1) Impression: She may have an infected seroma in the right axilla from the stereotactic biopsy. I did place her on doxycycline and see her back this Friday. If it is worse, I left a consider incision and drainage of the axilla. Eventually, we will proceed with surgical removal of the fibroadenoma of the right breast but I believe the infection to clear first. She is in agreement with the plan.  Addendum:  The erythema has improved, will proceed with right breast lumpectomy.  We again discussed the risks

## 2018-09-22 NOTE — Progress Notes (Signed)
Ensure pre surgery drink given with instructions to complete by 0400 dos, pt verbalized understanding. 

## 2018-09-23 ENCOUNTER — Ambulatory Visit (HOSPITAL_BASED_OUTPATIENT_CLINIC_OR_DEPARTMENT_OTHER): Payer: BLUE CROSS/BLUE SHIELD | Admitting: Certified Registered"

## 2018-09-23 ENCOUNTER — Other Ambulatory Visit: Payer: Self-pay

## 2018-09-23 ENCOUNTER — Encounter (HOSPITAL_BASED_OUTPATIENT_CLINIC_OR_DEPARTMENT_OTHER): Payer: Self-pay

## 2018-09-23 ENCOUNTER — Encounter (HOSPITAL_BASED_OUTPATIENT_CLINIC_OR_DEPARTMENT_OTHER)
Admission: RE | Disposition: A | Discharge status: Home / Self Care | Payer: Self-pay | Source: Ambulatory Visit | Attending: Surgery

## 2018-09-23 ENCOUNTER — Ambulatory Visit (HOSPITAL_BASED_OUTPATIENT_CLINIC_OR_DEPARTMENT_OTHER)
Admission: RE | Admit: 2018-09-23 | Discharge: 2018-09-23 | Disposition: A | Discharge status: Home / Self Care | Payer: BLUE CROSS/BLUE SHIELD | Source: Ambulatory Visit | Attending: Surgery | Admitting: Surgery

## 2018-09-23 DIAGNOSIS — Z79891 Long term (current) use of opiate analgesic: Secondary | ICD-10-CM | POA: Insufficient documentation

## 2018-09-23 DIAGNOSIS — F17200 Nicotine dependence, unspecified, uncomplicated: Secondary | ICD-10-CM | POA: Diagnosis not present

## 2018-09-23 DIAGNOSIS — D241 Benign neoplasm of right breast: Secondary | ICD-10-CM | POA: Insufficient documentation

## 2018-09-23 DIAGNOSIS — Z791 Long term (current) use of non-steroidal anti-inflammatories (NSAID): Secondary | ICD-10-CM | POA: Diagnosis not present

## 2018-09-23 DIAGNOSIS — Z79899 Other long term (current) drug therapy: Secondary | ICD-10-CM | POA: Insufficient documentation

## 2018-09-23 HISTORY — PX: BREAST LUMPECTOMY: SHX2

## 2018-09-23 HISTORY — DX: Anemia, unspecified: D64.9

## 2018-09-23 SURGERY — BREAST LUMPECTOMY
Anesthesia: General | Site: Breast | Laterality: Right

## 2018-09-23 MED ORDER — BUPIVACAINE-EPINEPHRINE (PF) 0.25% -1:200000 IJ SOLN
INTRAMUSCULAR | Status: DC | PRN
  Administered 2018-09-23: 9 mL

## 2018-09-23 MED ORDER — OXYCODONE HCL 5 MG PO TABS
5.0000 mg | ORAL_TABLET | Freq: Once | ORAL | Status: AC | PRN
  Administered 2018-09-23: 5 mg via ORAL

## 2018-09-23 MED ORDER — PROMETHAZINE HCL 25 MG PO TABS: 25.0000 mg | ORAL_TABLET | Freq: Four times a day (QID) | ORAL | 0 refills | Status: DC | PRN

## 2018-09-23 MED ORDER — CEFAZOLIN SODIUM-DEXTROSE 2-4 GM/100ML-% IV SOLN
2.0000 g | INTRAVENOUS | Status: AC
  Administered 2018-09-23: 2 g via INTRAVENOUS

## 2018-09-23 MED ORDER — CHLORHEXIDINE GLUCONATE CLOTH 2 % EX PADS: 6.0000 | MEDICATED_PAD | Freq: Once | CUTANEOUS | Status: DC

## 2018-09-23 MED ORDER — LACTATED RINGERS IV SOLN
INTRAVENOUS | Status: DC
  Administered 2018-09-23: 07:00:00 via INTRAVENOUS

## 2018-09-23 MED ORDER — ACETAMINOPHEN 500 MG PO TABS
ORAL_TABLET | ORAL | Status: AC
  Filled 2018-09-23: qty 2

## 2018-09-23 MED ORDER — MIDAZOLAM HCL 2 MG/2ML IJ SOLN
INTRAMUSCULAR | Status: AC
  Filled 2018-09-23: qty 2

## 2018-09-23 MED ORDER — BUPIVACAINE HCL (PF) 0.25 % IJ SOLN
INTRAMUSCULAR | Status: AC
  Filled 2018-09-23: qty 30

## 2018-09-23 MED ORDER — SCOPOLAMINE 1 MG/3DAYS TD PT72
MEDICATED_PATCH | TRANSDERMAL | Status: DC | PRN
  Administered 2018-09-23: 1 via TRANSDERMAL

## 2018-09-23 MED ORDER — PROMETHAZINE HCL 25 MG/ML IJ SOLN: 6.2500 mg | INTRAMUSCULAR | Status: DC | PRN

## 2018-09-23 MED ORDER — SCOPOLAMINE 1 MG/3DAYS TD PT72
MEDICATED_PATCH | TRANSDERMAL | Status: AC
  Filled 2018-09-23: qty 1

## 2018-09-23 MED ORDER — CELECOXIB 200 MG PO CAPS
ORAL_CAPSULE | ORAL | Status: AC
  Filled 2018-09-23: qty 1

## 2018-09-23 MED ORDER — LACTATED RINGERS IV SOLN: INTRAVENOUS | Status: DC

## 2018-09-23 MED ORDER — SCOPOLAMINE 1 MG/3DAYS TD PT72: 1.0000 | MEDICATED_PATCH | Freq: Once | TRANSDERMAL | Status: DC | PRN

## 2018-09-23 MED ORDER — ACETAMINOPHEN 500 MG PO TABS
1000.0000 mg | ORAL_TABLET | ORAL | Status: AC
  Administered 2018-09-23: 1000 mg via ORAL

## 2018-09-23 MED ORDER — MEPERIDINE HCL 25 MG/ML IJ SOLN: 6.2500 mg | INTRAMUSCULAR | Status: DC | PRN

## 2018-09-23 MED ORDER — MIDAZOLAM HCL 2 MG/2ML IJ SOLN
INTRAMUSCULAR | Status: DC | PRN
  Administered 2018-09-23: 2 mg via INTRAVENOUS

## 2018-09-23 MED ORDER — CELECOXIB 200 MG PO CAPS
200.0000 mg | ORAL_CAPSULE | ORAL | Status: AC
  Administered 2018-09-23: 200 mg via ORAL

## 2018-09-23 MED ORDER — PROPOFOL 10 MG/ML IV BOLUS
INTRAVENOUS | Status: AC
  Filled 2018-09-23: qty 20

## 2018-09-23 MED ORDER — BUPIVACAINE-EPINEPHRINE (PF) 0.25% -1:200000 IJ SOLN
INTRAMUSCULAR | Status: AC
  Filled 2018-09-23: qty 30

## 2018-09-23 MED ORDER — BUPIVACAINE HCL (PF) 0.5 % IJ SOLN
INTRAMUSCULAR | Status: AC
  Filled 2018-09-23: qty 30

## 2018-09-23 MED ORDER — DEXAMETHASONE SODIUM PHOSPHATE 10 MG/ML IJ SOLN
INTRAMUSCULAR | Status: DC | PRN
  Administered 2018-09-23: 10 mg via INTRAVENOUS

## 2018-09-23 MED ORDER — FENTANYL CITRATE (PF) 100 MCG/2ML IJ SOLN
INTRAMUSCULAR | Status: DC | PRN
  Administered 2018-09-23 (×3): 50 ug via INTRAVENOUS

## 2018-09-23 MED ORDER — SODIUM BICARBONATE 4 % IV SOLN
INTRAVENOUS | Status: AC
  Filled 2018-09-23: qty 5

## 2018-09-23 MED ORDER — HYDROMORPHONE HCL 1 MG/ML IJ SOLN
INTRAMUSCULAR | Status: AC
  Filled 2018-09-23: qty 0.5

## 2018-09-23 MED ORDER — OXYCODONE HCL 5 MG/5ML PO SOLN: 5.0000 mg | Freq: Once | ORAL | Status: AC | PRN

## 2018-09-23 MED ORDER — FENTANYL CITRATE (PF) 100 MCG/2ML IJ SOLN
INTRAMUSCULAR | Status: AC
  Filled 2018-09-23: qty 2

## 2018-09-23 MED ORDER — GABAPENTIN 300 MG PO CAPS
300.0000 mg | ORAL_CAPSULE | ORAL | Status: AC
  Administered 2018-09-23: 300 mg via ORAL

## 2018-09-23 MED ORDER — MIDAZOLAM HCL 2 MG/2ML IJ SOLN: 1.0000 mg | INTRAMUSCULAR | Status: DC | PRN

## 2018-09-23 MED ORDER — PROPOFOL 10 MG/ML IV BOLUS
INTRAVENOUS | Status: DC | PRN
  Administered 2018-09-23: 200 mg via INTRAVENOUS

## 2018-09-23 MED ORDER — SUCCINYLCHOLINE CHLORIDE 200 MG/10ML IV SOSY
PREFILLED_SYRINGE | INTRAVENOUS | Status: DC | PRN
  Administered 2018-09-23: 120 mg via INTRAVENOUS

## 2018-09-23 MED ORDER — FENTANYL CITRATE (PF) 100 MCG/2ML IJ SOLN: 50.0000 ug | INTRAMUSCULAR | Status: DC | PRN

## 2018-09-23 MED ORDER — OXYCODONE HCL 5 MG PO TABS
ORAL_TABLET | ORAL | Status: AC
  Filled 2018-09-23: qty 1

## 2018-09-23 MED ORDER — LIDOCAINE HCL (PF) 1 % IJ SOLN
INTRAMUSCULAR | Status: AC
  Filled 2018-09-23: qty 30

## 2018-09-23 MED ORDER — HYDROMORPHONE HCL 1 MG/ML IJ SOLN
0.2500 mg | INTRAMUSCULAR | Status: DC | PRN
  Administered 2018-09-23: 0.5 mg via INTRAVENOUS

## 2018-09-23 MED ORDER — GABAPENTIN 300 MG PO CAPS
ORAL_CAPSULE | ORAL | Status: AC
  Filled 2018-09-23: qty 1

## 2018-09-23 MED ORDER — OXYCODONE HCL 5 MG PO CAPS: 5.0000 mg | ORAL_CAPSULE | Freq: Four times a day (QID) | ORAL | 0 refills | Status: DC | PRN

## 2018-09-23 MED ORDER — CEFAZOLIN SODIUM-DEXTROSE 2-4 GM/100ML-% IV SOLN
INTRAVENOUS | Status: AC
  Filled 2018-09-23: qty 100

## 2018-09-23 MED ORDER — LIDOCAINE 2% (20 MG/ML) 5 ML SYRINGE
INTRAMUSCULAR | Status: DC | PRN
  Administered 2018-09-23: 40 mg via INTRAVENOUS

## 2018-09-23 SURGICAL SUPPLY — 39 items
BLADE HEX COATED 2.75 (ELECTRODE) ×2 IMPLANT
BLADE SURG 15 STRL LF DISP TIS (BLADE) ×1 IMPLANT
BLADE SURG 15 STRL SS (BLADE) ×1
CANISTER SUCT 1200ML W/VALVE (MISCELLANEOUS) IMPLANT
CHLORAPREP W/TINT 26ML (MISCELLANEOUS) ×2 IMPLANT
CLIP VESOCCLUDE SM WIDE 6/CT (CLIP) IMPLANT
COVER BACK TABLE 60X90IN (DRAPES) ×2 IMPLANT
COVER MAYO STAND STRL (DRAPES) ×2 IMPLANT
COVER WAND RF STERILE (DRAPES) IMPLANT
DECANTER SPIKE VIAL GLASS SM (MISCELLANEOUS) IMPLANT
DERMABOND ADVANCED (GAUZE/BANDAGES/DRESSINGS) ×1
DERMABOND ADVANCED .7 DNX12 (GAUZE/BANDAGES/DRESSINGS) ×1 IMPLANT
DEVICE DUBIN W/COMP PLATE 8390 (MISCELLANEOUS) IMPLANT
DRAPE LAPAROTOMY 100X72 PEDS (DRAPES) ×2 IMPLANT
DRAPE UTILITY XL STRL (DRAPES) ×2 IMPLANT
ELECT REM PT RETURN 9FT ADLT (ELECTROSURGICAL) ×2
ELECTRODE REM PT RTRN 9FT ADLT (ELECTROSURGICAL) ×1 IMPLANT
GAUZE SPONGE 4X4 12PLY STRL LF (GAUZE/BANDAGES/DRESSINGS) ×2 IMPLANT
GLOVE SURG SIGNA 7.5 PF LTX (GLOVE) ×2 IMPLANT
GOWN STRL REUS W/ TWL LRG LVL3 (GOWN DISPOSABLE) ×1 IMPLANT
GOWN STRL REUS W/ TWL XL LVL3 (GOWN DISPOSABLE) ×1 IMPLANT
GOWN STRL REUS W/TWL LRG LVL3 (GOWN DISPOSABLE) ×1
GOWN STRL REUS W/TWL XL LVL3 (GOWN DISPOSABLE) ×1
KIT MARKER MARGIN INK (KITS) ×2 IMPLANT
NEEDLE HYPO 25X1 1.5 SAFETY (NEEDLE) ×2 IMPLANT
NS IRRIG 1000ML POUR BTL (IV SOLUTION) ×2 IMPLANT
PACK BASIN DAY SURGERY FS (CUSTOM PROCEDURE TRAY) ×2 IMPLANT
PENCIL BUTTON HOLSTER BLD 10FT (ELECTRODE) ×2 IMPLANT
SLEEVE SCD COMPRESS KNEE MED (MISCELLANEOUS) IMPLANT
SPONGE LAP 4X18 RFD (DISPOSABLE) ×2 IMPLANT
SUT MNCRL AB 4-0 PS2 18 (SUTURE) ×2 IMPLANT
SUT SILK 2 0 SH (SUTURE) ×2 IMPLANT
SUT VIC AB 3-0 SH 27 (SUTURE) ×1
SUT VIC AB 3-0 SH 27X BRD (SUTURE) ×1 IMPLANT
SYR CONTROL 10ML LL (SYRINGE) ×2 IMPLANT
TOWEL GREEN STERILE FF (TOWEL DISPOSABLE) ×2 IMPLANT
TOWEL OR NON WOVEN STRL DISP B (DISPOSABLE) ×2 IMPLANT
TUBE CONNECTING 20X1/4 (TUBING) IMPLANT
YANKAUER SUCT BULB TIP NO VENT (SUCTIONS) IMPLANT

## 2018-09-23 NOTE — Transfer of Care (Signed)
Immediate Anesthesia Transfer of Care Note  Patient: Anita [name]  Procedure(s) Performed: RIGHT BREAST LUMPECTOMY ERAS PATHWAY (Right Breast)  Patient Location: PACU  Anesthesia Type:General  Level of Consciousness: awake and alert   Airway & Oxygen Therapy: Patient Spontanous Breathing and Patient connected to face mask oxygen  Post-op Assessment: Report given to RN and Post -op Vital signs reviewed and stable  Post vital signs: Reviewed and stable  Last Vitals:  Vitals Value Taken Time  BP 128/87 09/23/2018  8:15 AM  Temp 36.7 C 09/23/2018  8:13 AM  Pulse 92 09/23/2018  8:19 AM  Resp 19 09/23/2018  8:19 AM  SpO2 99 % 09/23/2018  8:19 AM  Vitals shown include unvalidated device data.  Last Pain:  Vitals:   09/23/18 2256  TempSrc: Oral  PainSc: 0-No pain         Complications: No apparent anesthesia complications

## 2018-09-23 NOTE — Interval H&P Note (Signed)
History and Physical Interval Note:no change in H and P 09/23/2018 7:00 AM  Anita [name]  has presented today for surgery, with the diagnosis of Right breast mass (fibroadenoma)  The various methods of treatment have been discussed with the patient and family. After consideration of risks, benefits and other options for treatment, the patient has consented to  Procedure(s): RIGHT BREAST LUMPECTOMY ERAS PATHWAY (Right) as a surgical intervention .  The patient's history has been reviewed, patient examined, no change in status, stable for surgery.  I have reviewed the patient's chart and labs.  Questions were answered to the patient's satisfaction.     Byanca Kasper A

## 2018-09-23 NOTE — Anesthesia Postprocedure Evaluation (Signed)
Anesthesia Post Note  Patient: Anita [name]  Procedure(s) Performed: RIGHT BREAST LUMPECTOMY ERAS PATHWAY (Right Breast)     Patient location during evaluation: PACU Anesthesia Type: General Level of consciousness: awake and alert Pain management: pain level controlled Vital Signs Assessment: post-procedure vital signs reviewed and stable Respiratory status: spontaneous breathing, nonlabored ventilation, respiratory function stable and patient connected to nasal cannula oxygen Cardiovascular status: blood pressure returned to baseline and stable Postop Assessment: no apparent nausea or vomiting Anesthetic complications: no    Last Vitals:  Vitals:   09/23/18 0900 09/23/18 0922  BP: (!) 134/99 133/90  Pulse: 78 93  Resp: 12 18  Temp:  37.1 C  SpO2: 100% 100%    Last Pain:  Vitals:   09/23/18 0922  TempSrc: Oral  PainSc: 3                  Effie Berkshire

## 2018-09-23 NOTE — Anesthesia Preprocedure Evaluation (Addendum)
Anesthesia Evaluation  Patient identified by MRN, date of birth, ID band Patient awake    Reviewed: Allergy & Precautions, NPO status , Patient's Chart, lab work & pertinent test results  Airway Mallampati: II  TM Distance: <3 FB Neck ROM: Full    Dental  (+) Teeth Intact, Dental Advisory Given   Pulmonary    breath sounds clear to auscultation       Cardiovascular negative cardio ROS   Rhythm:Regular Rate:Normal     Neuro/Psych  Headaches, Depression    GI/Hepatic negative GI ROS, Neg liver ROS,   Endo/Other  negative endocrine ROS  Renal/GU negative Renal ROS     Musculoskeletal negative musculoskeletal ROS (+)   Abdominal Normal abdominal exam  (+)   Peds  Hematology negative hematology ROS (+)   Anesthesia Other Findings   Reproductive/Obstetrics                            Lab Results  Component Value Date   WBC 12.6 (H) 09/11/2018   HGB 14.3 09/11/2018   HCT 45.1 09/11/2018   MCV 93.0 09/11/2018   PLT 308 09/11/2018   Lab Results  Component Value Date   CREATININE 0.89 09/11/2018   BUN 16 09/11/2018   NA 138 09/11/2018   K 3.2 (L) 09/11/2018   CL 105 09/11/2018   CO2 22 09/11/2018   No results found for: INR, PROTIME   Anesthesia Physical Anesthesia Plan  ASA: II  Anesthesia Plan: General   Post-op Pain Management:    Induction: Intravenous, Cricoid pressure planned and Rapid sequence  PONV Risk Score and Plan: 4 or greater and Dexamethasone, Midazolam, Scopolamine patch - Pre-op and Metaclopromide  Airway Management Planned: Oral ETT  Additional Equipment: None  Intra-op Plan:   Post-operative Plan: Extubation in OR  Informed Consent: I have reviewed the patients History and Physical, chart, labs and discussed the procedure including the risks, benefits and alternatives for the proposed anesthesia with the patient or authorized representative who has  indicated his/her understanding and acceptance.   Dental advisory given  Plan Discussed with: CRNA  Anesthesia Plan Comments:       Anesthesia Quick Evaluation

## 2018-09-23 NOTE — Op Note (Signed)
RIGHT BREAST LUMPECTOMY ERAS PATHWAY  Procedure Note  Anita [name] 09/23/2018   Pre-op Diagnosis: Right breast mass (fibroadenoma)     Post-op Diagnosis: same  Procedure(s): RIGHT BREAST LUMPECTOMY ERAS PATHWAY  Surgeon(s): Coralie Keens, MD  Anesthesia: General  Staff:  Circulator: Eda Paschal, RN Scrub Person: Rosana Fret, Dionne Bucy, CST  Estimated Blood Loss: Minimal               Specimens: sent to path  Indications: This is [name] 27 year old female with [name] history of [name] right breast fibroadenoma.  It is getting larger and causing discomfort so the decision has been made to proceed with [name] right breast lumpectomy  Procedure: The patient was brought to the operating room and identified as the correct patient.  She was placed supine on the operating room table and general anesthesia was induced.  Her right breast was then prepped and draped in usual sterile fashion.  I anesthetized the lower edge of the areola with Marcaine.  The palpable mass was located at the 6 o'clock position 3 to 4 cm from the nipple areolar complex.  I made an incision with [name] scalpel and then with cautery tunneled inferiorly toward the palpable mass.  Identified the mass which appeared consistent with [name] fibroadenoma.  It was excised completely with the cautery and then sent to pathology for evaluation.  I anesthetized the incision further with Marcaine.  I then achieved hemostasis with the cautery.  Next I closed the subcutaneous tissue with interrupted 3-0 Vicryl sutures and closed the skin with [name] running 4-0 Monocryl.  Dermabond was then applied.  The patient tolerated the procedure well.  All the counts were correct at the end of the procedure.  The patient was then extubated in the operating room and taken in [name] stable condition to the recovery room.          Anita [name]   Date: 09/23/2018  Time: 8:02 AM

## 2018-09-23 NOTE — Anesthesia Procedure Notes (Signed)
Procedure Name: Intubation Date/Time: 09/23/2018 7:35 AM Performed by: Cynda Familia, CRNA Pre-anesthesia Checklist: Patient identified, Emergency Drugs available, Suction available and Patient being monitored Patient Re-evaluated:Patient Re-evaluated prior to induction Oxygen Delivery Method: Circle System Utilized Preoxygenation: Pre-oxygenation with 100% oxygen Induction Type: IV induction and Rapid sequence Ventilation: Mask ventilation without difficulty Grade View: Grade I Tube type: Oral Tube size: 7.0 mm Number of attempts: 1 Airway Equipment and Method: Stylet Placement Confirmation: ETT inserted through vocal cords under direct vision,  positive ETCO2 and breath sounds checked- equal and bilateral Secured at: 21 cm Tube secured with: Tape Dental Injury: Teeth and Oropharynx as per pre-operative assessment  Comments: Smooth RSI- Hollis-- intubatin AM CRNA atraumatic-- teeth and mouth as preop-- bilat BS [employer]

## 2018-09-23 NOTE — Discharge Instructions (Signed)
[place] Office Phone Number [phone]  BREAST BIOPSY/ PARTIAL MASTECTOMY: POST OP INSTRUCTIONS  Always review your discharge instruction sheet given to you by the facility where your surgery was performed.  IF YOU HAVE DISABILITY OR FAMILY LEAVE FORMS, YOU MUST BRING THEM TO THE OFFICE FOR PROCESSING.  DO NOT GIVE THEM TO YOUR DOCTOR.  1. A prescription for pain medication may be given to you upon discharge.  Take your pain medication as prescribed, if needed.  If narcotic pain medicine is not needed, then you may take acetaminophen (Tylenol) or ibuprofen (Advil) as needed. No Tylenol until 1:00pm, no ibuprofen until 3:00pm. Do not take ibuprofen and meloxicam together.   2. Take your usually prescribed medications unless otherwise directed 3. If you need a refill on your pain medication, please contact your pharmacy.  They will contact our office to request authorization.  Prescriptions will not be filled after 5pm or on week-ends. 4. You should eat very light the first 24 hours after surgery, such as soup, crackers, pudding, etc.  Resume your normal diet the day after surgery. 5. Most patients will experience some swelling and bruising in the breast.  Ice packs and a good support bra will help.  Swelling and bruising can take several days to resolve.  6. It is common to experience some constipation if taking pain medication after surgery.  Increasing fluid intake and taking a stool softener will usually help or prevent this problem from occurring.  A mild laxative (Milk of Magnesia or Miralax) should be taken according to package directions if there are no bowel movements after 48 hours. 7. Unless discharge instructions indicate otherwise, you may remove your bandages 24-48 hours after surgery, and you may shower at that time.  You may have steri-strips (small skin tapes) in place directly over the incision.  These strips should be left on the skin for 7-10 days.  If your  surgeon used skin glue on the incision, you may shower in 24 hours.  The glue will flake off over the next 2-3 weeks.  Any sutures or staples will be removed at the office during your follow-up visit. 8. ACTIVITIES:  You may resume regular daily activities (gradually increasing) beginning the next day.  Wearing a good support bra or sports bra minimizes pain and swelling.  You may have sexual intercourse when it is comfortable. a. You may drive when you no longer are taking prescription pain medication, you can comfortably wear a seatbelt, and you can safely maneuver your car and apply brakes. b. RETURN TO WORK:  ______________________________________________________________________________________ 9. You should see your doctor in the office for a follow-up appointment approximately two weeks after your surgery.  Your doctors nurse will typically make your follow-up appointment when she calls you with your pathology report.  Expect your pathology report 2-3 business days after your surgery.  You may call to check if you do not hear from [place] after three days. 10. OTHER INSTRUCTIONS:OK TO SHOWER STARTING TOMORROW 11. ICE PACK, IBUPROFEN, TYLENOL ALSO FOR PAIN _______________________________________________________________________________________________ _____________________________________________________________________________________________________________________________________ _____________________________________________________________________________________________________________________________________ _____________________________________________________________________________________________________________________________________  WHEN TO CALL YOUR DOCTOR: 1. Fever over 101.0 2. Nausea and/or vomiting. 3. Extreme swelling or bruising. 4. Continued bleeding from incision. 5. Increased pain, redness, or drainage from the incision.  The clinic staff is available to answer your questions  during regular business hours.  Please dont hesitate to call and ask to speak to one of the nurses for clinical concerns.  If you have a medical emergency, go to  the nearest emergency room or call 911.  A surgeon from [facility] is always on call at the hospital.  For further questions, please visit [email or web address]     Post Anesthesia Home Care Instructions  Activity: Get plenty of rest for the remainder of the day. A responsible individual must stay with you for 24 hours following the procedure.  For the next 24 hours, DO NOT: -Drive a car -Paediatric nurse -Drink alcoholic beverages -Take any medication unless instructed by your physician -Make any legal decisions or sign important papers.  Meals: Start with liquid foods such as gelatin or soup. Progress to regular foods as tolerated. Avoid greasy, spicy, heavy foods. If nausea and/or vomiting occur, drink only clear liquids until the nausea and/or vomiting subsides. Call your physician if vomiting continues.  Special Instructions/Symptoms: Your throat may feel dry or sore from the anesthesia or the breathing tube placed in your throat during surgery. If this causes discomfort, gargle with warm salt water. The discomfort should disappear within 24 hours.  If you had a scopolamine patch placed behind your ear for the management of post- operative nausea and/or vomiting:  1. The medication in the patch is effective for 72 hours, after which it should be removed.  Wrap patch in a tissue and discard in the trash. Wash hands thoroughly with soap and water. 2. You may remove the patch earlier than 72 hours if you experience unpleasant side effects which may include dry mouth, dizziness or visual disturbances. 3. Avoid touching the patch. Wash your hands with soap and water after contact with the patch.

## 2018-09-26 ENCOUNTER — Encounter (HOSPITAL_BASED_OUTPATIENT_CLINIC_OR_DEPARTMENT_OTHER): Payer: Self-pay | Admitting: Surgery

## 2018-11-22 ENCOUNTER — Encounter: Payer: Self-pay | Admitting: Obstetrics and Gynecology

## 2019-03-13 DIAGNOSIS — I889 Nonspecific lymphadenitis, unspecified: Secondary | ICD-10-CM | POA: Diagnosis not present

## 2020-04-05 ENCOUNTER — Emergency Department (HOSPITAL_COMMUNITY): Payer: Self-pay

## 2020-04-05 ENCOUNTER — Encounter (HOSPITAL_COMMUNITY): Payer: Self-pay | Admitting: Emergency Medicine

## 2020-04-05 ENCOUNTER — Other Ambulatory Visit: Payer: Self-pay

## 2020-04-05 ENCOUNTER — Emergency Department (HOSPITAL_COMMUNITY)
Admission: EM | Admit: 2020-04-05 | Discharge: 2020-04-05 | Disposition: A | Discharge status: Home / Self Care | Payer: Self-pay | Attending: Emergency Medicine | Admitting: Emergency Medicine

## 2020-04-05 DIAGNOSIS — F1290 Cannabis use, unspecified, uncomplicated: Secondary | ICD-10-CM | POA: Insufficient documentation

## 2020-04-05 DIAGNOSIS — N3001 Acute cystitis with hematuria: Secondary | ICD-10-CM | POA: Insufficient documentation

## 2020-04-05 DIAGNOSIS — R1084 Generalized abdominal pain: Secondary | ICD-10-CM | POA: Insufficient documentation

## 2020-04-05 DIAGNOSIS — R112 Nausea with vomiting, unspecified: Secondary | ICD-10-CM | POA: Insufficient documentation

## 2020-04-05 LAB — COMPREHENSIVE METABOLIC PANEL
ALT: 31 U/L (ref 0–44)
AST: 29 U/L (ref 15–41)
Albumin: 4.6 g/dL (ref 3.5–5.0)
Alkaline Phosphatase: 73 U/L (ref 38–126)
Anion gap: 15 (ref 5–15)
BUN: 12 mg/dL (ref 6–20)
CO2: 25 mmol/L (ref 22–32)
Calcium: 10 mg/dL (ref 8.9–10.3)
Chloride: 100 mmol/L (ref 98–111)
Creatinine, Ser: 0.78 mg/dL (ref 0.44–1.00)
GFR calc Af Amer: >60 mL/min (ref >60)
GFR calc non Af Amer: >60 mL/min (ref >60)
Glucose, Bld: 122 mg/dL — ABNORMAL HIGH (ref 70–99)
Potassium: 3.2 mmol/L — ABNORMAL LOW (ref 3.5–5.1)
Sodium: 140 mmol/L (ref 135–145)
Total Bilirubin: 0.7 mg/dL (ref 0.3–1.2)
Total Protein: 7.9 g/dL (ref 6.5–8.1)

## 2020-04-05 LAB — CBC WITH DIFFERENTIAL/PLATELET
Abs Immature Granulocytes: 0.05 10*3/uL (ref 0.00–0.07)
Basophils Absolute: 0 10*3/uL (ref 0.0–0.1)
Basophils Relative: 0 %
Eosinophils Absolute: 0 10*3/uL (ref 0.0–0.5)
Eosinophils Relative: 0 %
HCT: 46 % (ref 36.0–46.0)
Hemoglobin: 15.6 g/dL — ABNORMAL HIGH (ref 12.0–15.0)
Immature Granulocytes: 0 %
Lymphocytes Relative: 10 %
Lymphs Abs: 1.3 10*3/uL (ref 0.7–4.0)
MCH: 31.5 pg (ref 26.0–34.0)
MCHC: 33.9 g/dL (ref 30.0–36.0)
MCV: 92.9 fL (ref 80.0–100.0)
Monocytes Absolute: 0.5 10*3/uL (ref 0.1–1.0)
Monocytes Relative: 4 %
Neutro Abs: 10.9 10*3/uL — ABNORMAL HIGH (ref 1.7–7.7)
Neutrophils Relative %: 86 %
Platelets: 213 10*3/uL (ref 150–400)
RBC: 4.95 MIL/uL (ref 3.87–5.11)
RDW: 11.8 % (ref 11.5–15.5)
WBC: 12.8 10*3/uL — ABNORMAL HIGH (ref 4.0–10.5)
nRBC: 0 % (ref 0.0–0.2)

## 2020-04-05 LAB — URINALYSIS, ROUTINE W REFLEX MICROSCOPIC
Bilirubin Urine: NEGATIVE
Glucose, UA: NEGATIVE mg/dL
Ketones, ur: 80 mg/dL — AB
Nitrite: POSITIVE — AB
Protein, ur: 30 mg/dL — AB
Specific Gravity, Urine: 1.038 — ABNORMAL HIGH (ref 1.005–1.030)
pH: 6 (ref 5.0–8.0)

## 2020-04-05 LAB — I-STAT BETA HCG BLOOD, ED (MC, WL, AP ONLY): I-stat hCG, quantitative: <5 m[IU]/mL (ref <5)

## 2020-04-05 LAB — LIPASE, BLOOD: Lipase: 21 U/L (ref 11–51)

## 2020-04-05 MED ORDER — ONDANSETRON HCL 4 MG/2ML IJ SOLN
4.0000 mg | Freq: Once | INTRAMUSCULAR | Status: AC
  Administered 2020-04-05: 4 mg via INTRAVENOUS
  Filled 2020-04-05: qty 2

## 2020-04-05 MED ORDER — POTASSIUM CHLORIDE CRYS ER 20 MEQ PO TBCR
30.0000 meq | EXTENDED_RELEASE_TABLET | Freq: Once | ORAL | Status: DC
  Filled 2020-04-05: qty 1

## 2020-04-05 MED ORDER — CEPHALEXIN 500 MG PO CAPS
500.0000 mg | ORAL_CAPSULE | Freq: Four times a day (QID) | ORAL | 0 refills | Status: DC
Start: 2020-04-05 — End: 2020-04-07

## 2020-04-05 MED ORDER — PROMETHAZINE HCL 25 MG/ML IJ SOLN
25.0000 mg | Freq: Once | INTRAMUSCULAR | Status: AC
  Administered 2020-04-05: 25 mg via INTRAVENOUS
  Filled 2020-04-05: qty 1

## 2020-04-05 MED ORDER — MORPHINE SULFATE (PF) 4 MG/ML IV SOLN
4.0000 mg | Freq: Once | INTRAVENOUS | Status: AC
  Administered 2020-04-05: 4 mg via INTRAVENOUS
  Filled 2020-04-05: qty 1

## 2020-04-05 MED ORDER — SODIUM CHLORIDE 0.9 % IV BOLUS
1000.0000 mL | Freq: Once | INTRAVENOUS | Status: AC
  Administered 2020-04-05: 1000 mL via INTRAVENOUS

## 2020-04-05 MED ORDER — ONDANSETRON 4 MG PO TBDP
4.0000 mg | ORAL_TABLET | Freq: Three times a day (TID) | ORAL | 0 refills | Status: DC | PRN
Start: 2020-04-05 — End: 2021-01-28

## 2020-04-05 MED ORDER — HYDROMORPHONE HCL 1 MG/ML IJ SOLN
1.0000 mg | Freq: Once | INTRAMUSCULAR | Status: AC
  Administered 2020-04-05: 1 mg via INTRAVENOUS
  Filled 2020-04-05: qty 1

## 2020-04-05 MED ORDER — IOHEXOL 300 MG/ML  SOLN
100.0000 mL | Freq: Once | INTRAMUSCULAR | Status: AC | PRN
  Administered 2020-04-05: 100 mL via INTRAVENOUS

## 2020-04-05 NOTE — ED Provider Notes (Signed)
[facility] EMERGENCY DEPARTMENT Provider Note   CSN: [id number] Arrival date & time: 04/05/20  1206     History Chief Complaint  Patient presents with  . Emesis    Anita [name] is a 29 y.o. female with a past medical history significant for anemia, depression, PCOS, and vitamin D deficiency who presents to the ED due to gradual onset of worsening generalized abdominal pain associated with numerous episodes of nonbloody, nonbilious emesis for the past 3 days.  Patient admits to having roughly 7 episodes of nonbloody, nonbilious emesis today.  Unable to tolerate any p.o.  She admits to generalized abdominal pain which she describes as "cramping" which she attributes to "dry heaving".  Denies associated urinary and vaginal symptoms.  She is currently sexually active with one partner without protection.  She has no concerns for any STDs at this time.  Denies fever and chills.  She admits to smoking marijuana regularly.  Denies diarrhea.  No previous abdominal operations.  Not sure anything for symptoms.  No aggravating or alleviating factors.  History obtained from patient and past medical records. No interpreter used during encounter.      Past Medical History:  Diagnosis Date  . Anemia    history  . Depression   . History of chlamydia   . Hormone disorder   . Migraine without aura   . PCOS (polycystic ovarian syndrome)   . Vitamin D deficiency     Patient Active Problem List   Diagnosis Date Noted  . History of chlamydia   . Migraine without aura     Past Surgical History:  Procedure Laterality Date  . BREAST LUMPECTOMY Right 09/23/2018   Procedure: RIGHT BREAST LUMPECTOMY ERAS PATHWAY;  Surgeon: Coralie Keens, MD;  Location: [facility];  Service: General;  Laterality: Right;  . COLPOSCOPY    . MOUTH SURGERY       OB History    Gravida  0   Para  0   Term  0   Preterm  0   AB  0   Living  0     SAB  0   TAB  0   Ectopic  0   Multiple  0   Live Births  0           Family History  Problem Relation Age of Onset  . Fibroids Mother     Social History   Tobacco Use  . Smoking status: Never Smoker  . Smokeless tobacco: Never Used  Substance Use Topics  . Alcohol use: Yes  . Drug use: Yes    Types: Marijuana    Comment: smokes daily socially    Home Medications Prior to Admission medications   Medication Sig Start Date End Date Taking? Authorizing Provider  cephALEXin (KEFLEX) 500 MG capsule Take 1 capsule (500 mg total) by mouth 4 (four) times daily. 04/05/20   Suzy Bouchard, PA-C  meloxicam (MOBIC) 7.5 MG tablet Take 1 tablet (7.5 mg total) by mouth daily. 09/03/18   Tasia Catchings, Amy V, PA-C  ondansetron (ZOFRAN ODT) 4 MG disintegrating tablet Take 1 tablet (4 mg total) by mouth every 8 (eight) hours as needed for nausea or vomiting. 04/05/20   Suzy Bouchard, PA-C  oxycodone (OXY-IR) 5 MG capsule Take 1 capsule (5 mg total) by mouth every 6 (six) hours as needed for pain. 09/23/18   Coralie Keens, MD  promethazine (PHENERGAN) 25 MG tablet Take 1 tablet (25 mg total)  by mouth every 6 (six) hours as needed for nausea or vomiting. 09/23/18   Coralie Keens, MD    Allergies    Doxycycline and E-mycin [erythromycin]  Review of Systems   Review of Systems  Constitutional: Negative for chills and fever.  Gastrointestinal: Positive for abdominal pain, nausea and vomiting. Negative for diarrhea.  Genitourinary: Negative for dysuria and vaginal discharge.  All other systems reviewed and are negative.   Physical Exam Updated Vital Signs BP 131/68 (BP Location: Left Arm)   Pulse 78   Temp 98 F (36.7 C) (Oral)   Resp 15   Ht 5\' 4"  (1.626 m)   Wt 68 kg   SpO2 100%   BMI 25.75 kg/m   Physical Exam Vitals and nursing note reviewed.  Constitutional:      General: She is not in acute distress.    Appearance: She is not ill-appearing.  HENT:     Head: Normocephalic.    Eyes:     Pupils: Pupils are equal, round, and reactive to light.  Cardiovascular:     Rate and Rhythm: Normal rate and regular rhythm.     Pulses: Normal pulses.     Heart sounds: Normal heart sounds. No murmur. No friction rub. No gallop.   Pulmonary:     Effort: Pulmonary effort is normal.     Breath sounds: Normal breath sounds.  Abdominal:     General: Abdomen is flat. Bowel sounds are normal. There is no distension.     Palpations: Abdomen is soft.     Tenderness: There is abdominal tenderness. There is no guarding or rebound.     Comments: Generalized abdominal tenderness.  No rebound or guarding.  No peritoneal signs.  Negative CVA tenderness bilaterally.  Musculoskeletal:     Cervical back: Neck supple.     Comments: Able to move all 4 extremities without difficulty.  Skin:    General: Skin is warm and dry.  Neurological:     General: No focal deficit present.     Mental Status: She is alert.  Psychiatric:        Mood and Affect: Mood normal.        Behavior: Behavior normal.     ED Results / Procedures / Treatments   Labs (all labs ordered are listed, but only abnormal results are displayed) Labs Reviewed  CBC WITH DIFFERENTIAL/PLATELET - Abnormal; Notable for the following components:      Result Value   WBC 12.8 (*)    Hemoglobin 15.6 (*)    Neutro Abs 10.9 (*)    All other components within normal limits  COMPREHENSIVE METABOLIC PANEL - Abnormal; Notable for the following components:   Potassium 3.2 (*)    Glucose, Bld 122 (*)    All other components within normal limits  URINALYSIS, ROUTINE W REFLEX MICROSCOPIC - Abnormal; Notable for the following components:   APPearance HAZY (*)    Specific Gravity, Urine 1.038 (*)    Hgb urine dipstick MODERATE (*)    Ketones, ur 80 (*)    Protein, ur 30 (*)    Nitrite POSITIVE (*)    Leukocytes,Ua LARGE (*)    Bacteria, UA RARE (*)    All other components within normal limits  LIPASE, BLOOD  I-STAT BETA HCG  BLOOD, ED ([facility], [facility], [facility] ONLY)    EKG None  Radiology CT ABDOMEN PELVIS W CONTRAST  Result Date: 04/05/2020 CLINICAL DATA:  Nausea vomiting the EXAM: CT ABDOMEN AND PELVIS WITH CONTRAST  TECHNIQUE: Multidetector CT imaging of the abdomen and pelvis was performed using the standard protocol following bolus administration of intravenous contrast. CONTRAST:  171mL OMNIPAQUE IOHEXOL 300 MG/ML  SOLN COMPARISON:  None. FINDINGS: Lower chest: The visualized heart size within normal limits. No pericardial fluid/thickening. No hiatal hernia. The visualized portions of the lungs are clear. Hepatobiliary: Mildly diffusely decreased density seen throughout the liver parenchyma. Main portal vein is patent. No evidence of calcified gallstones, gallbladder wall thickening or biliary dilatation. Pancreas: Unremarkable. No pancreatic ductal dilatation or surrounding inflammatory changes. Spleen: Normal in size without focal abnormality. Adrenals/Urinary Tract: Both adrenal glands appear normal. The kidneys and collecting system appear normal without evidence of urinary tract calculus or hydronephrosis. Bladder is unremarkable. Stomach/Bowel: The stomach, small bowel, and colon are normal in appearance. No inflammatory changes, wall thickening, or obstructive findings.The appendix is normal. Vascular/Lymphatic: There are no enlarged mesenteric, retroperitoneal, or pelvic lymph nodes. No significant vascular findings are present. Reproductive: The uterus and adnexa are unremarkable. Other: No evidence of abdominal wall mass or hernia. Musculoskeletal: No acute or significant osseous findings. IMPRESSION: No acute intra-abdominal or pelvic pathology to explain the patient's symptoms. Electronically Signed   By: Prudencio Pair M.D.   On: 04/05/2020 19:16   DG Hand Complete Left  Result Date: 04/05/2020 CLINICAL DATA:  Injury to the left hand. EXAM: LEFT HAND - COMPLETE 3+ VIEW COMPARISON:  None. FINDINGS: There is no evidence of  fracture or dislocation. There is no evidence of arthropathy or other focal bone abnormality. Soft tissues are unremarkable. IMPRESSION: Negative. Electronically Signed   By: Constance Holster M.D.   On: 04/05/2020 18:59    Procedures Procedures (including critical care time)  Medications Ordered in ED Medications  potassium chloride (KLOR-CON) CR tablet 30 mEq (has no administration in time range)  sodium chloride 0.9 % bolus 1,000 mL (0 mLs Intravenous Stopped 04/05/20 1735)  ondansetron (ZOFRAN) injection 4 mg (4 mg Intravenous Given 04/05/20 1604)  morphine 4 MG/ML injection 4 mg (4 mg Intravenous Given 04/05/20 1604)  HYDROmorphone (DILAUDID) injection 1 mg (1 mg Intravenous Given 04/05/20 1735)  iohexol (OMNIPAQUE) 300 MG/ML solution 100 mL (100 mLs Intravenous Contrast Given 04/05/20 1839)  promethazine (PHENERGAN) injection 25 mg (25 mg Intravenous Given 04/05/20 2021)    ED Course  I have reviewed the triage vital signs and the nursing notes.  Pertinent labs & imaging results that were available during my care of the patient were reviewed by me and considered in my medical decision making (see chart for details).  Clinical Course as of Apr 05 2037  Fri Apr 05, 2020  1517 Potassium(!): 3.2 [CA]  1517 WBC(!): 12.8 [CA]  1519 I-stat hCG, quantitative: <5.0 [CA]  1519 Lipase: 21 [CA]  1958 Leukocytes,Ua(!): LARGE [CA]  2026 Nitrite(!): POSITIVE [CA]  2026 Bacteria, UA(!): RARE [CA]    Clinical Course User Index [CA] Suzy Bouchard, PA-C   MDM Rules/Calculators/A&P                     28 year old female presents to the ED due to generalized abdominal pain associated with nausea and vomiting for the past 3 days.  Patient denies urinary vaginal symptoms.  No concern for STDs at this time.  Stable vitals.  Patient in no acute distress and nontoxic-appearing.  Physical exam reassuring.  Generalized abdominal tenderness.  No focal tenderness.  No guarding or rebound.  No  peritoneal signs.  Low suspicion for acute abdomen at this time.  Abdominal labs and UA ordered at triage.  Will treat with IV fluids, Zofran, and morphine and reassess.  If abdomen continues to be tender, will most likely need CT abdomen to rule out any emergent intra-abdominal etiologies.  CBC reassuring with mild leukocytosis at 12.8.  CMP significant for hypokalemia at 3.2, but otherwise reassuring.  Potassium repleted here in the ED.  Lipase normal at 21.  Doubt pancreatitis.  Pregnancy test negative.  5:01 PM reassessed patient at bedside who notes improvement in nausea however admits to continued abdominal pain.  Abdomen soft, nondistended with diffuse tenderness.  Will obtain CT abdomen to rule out any intra-abdominal etiologies. Patient also admits to left hand pain after an injury a few days prior. Tenderness over 5th metacarpal. Will obtain x-ray to rule out bony fractures.  X-ray personally reviewed which is negative for signs of infection. Upon reassessment, patient notes improvement in symptoms. Patient able to tolerate po at bedside and notes she is ready to go home. UA significant for nitrites and leukocytes.  Will discharge patient with antibiotics for acute cystitis.  Also discharge patient with Zofran as needed for nausea.  Instructed patient to follow-up with PCP if symptoms not improved within the next week. Strict ED precautions discussed with patient. Patient states understanding and agrees to plan. Patient discharged home in no acute distress and stable vitals  Final Clinical Impression(s) / ED Diagnoses Final diagnoses:  Nausea and vomiting, intractability of vomiting not specified, unspecified vomiting type  Acute cystitis with hematuria    Rx / DC Orders ED Discharge Orders         Ordered    ondansetron (ZOFRAN ODT) 4 MG disintegrating tablet  Every 8 hours PRN     04/05/20 2027    cephALEXin (KEFLEX) 500 MG capsule  4 times daily     04/05/20 2027             Karie Kirks 04/05/20 2038    Malvin Johns, MD 04/05/20 2149

## 2020-04-05 NOTE — Discharge Instructions (Addendum)
As discussed, your urine showed signs of a urinary tract infection.  I am sending you home with an antibiotic.  Take as prescribed.  Your CT abdomen was normal.  I am sending home with nausea medication.  You may take over-the-counter ibuprofen or Tylenol as needed for pain.  Please follow-up with PCP if symptoms do not improve within the next week.  Return to the ER for new or worsening symptoms.

## 2020-04-05 NOTE — ED Triage Notes (Signed)
Pt reports abd pain and vomiting x2-3 days, denies diarrhea or fevers.

## 2020-04-05 NOTE — ED Provider Notes (Signed)
Patient placed in Quick Look pathway, seen and evaluated   Chief Complaint:N/V abd pain  HPI: Otherwise health no daily medication use. Onset 3 days ago. Began with nausea and nbnb emesis. After several hours developed abdominal pain from "dry heaving". Describes generalized cramping pain. Reports feeling warm at home but hasn't measure a fever.  ROS: + N/V and abd pain           - sore throat, cp, sob, hematemesis, diarrhea, dysuria, hematuria, vaginal discharge  Currently on menstrual cycle.  Physical Exam:   Gen: No distress  Neuro: Awake and Alert  Skin: Warm    Focused Exam: mild generalized abd pain without focal tenderness  CBC, CMP, Lipase, Urinalysis, pregnancy test ordered. NS bolus and 4mg  Zofran ordered.   Initiation of care has begun. The patient has been counseled on the process, plan, and necessity for staying for the completion/evaluation, and the remainder of the medical screening examination    Gari Crown 04/05/20 1253    Noemi Chapel, MD 04/06/20 [phone]

## 2020-04-07 ENCOUNTER — Other Ambulatory Visit: Payer: Self-pay

## 2020-04-07 ENCOUNTER — Emergency Department: Payer: Medicaid Other

## 2020-04-07 ENCOUNTER — Emergency Department
Admission: EM | Admit: 2020-04-07 | Discharge: 2020-04-07 | Disposition: A | Discharge status: Home / Self Care | Payer: Medicaid Other | Attending: Emergency Medicine | Admitting: Emergency Medicine

## 2020-04-07 DIAGNOSIS — Z79899 Other long term (current) drug therapy: Secondary | ICD-10-CM | POA: Insufficient documentation

## 2020-04-07 DIAGNOSIS — N3000 Acute cystitis without hematuria: Secondary | ICD-10-CM | POA: Insufficient documentation

## 2020-04-07 LAB — COMPREHENSIVE METABOLIC PANEL
ALT: 29 U/L (ref 0–44)
AST: 28 U/L (ref 15–41)
Albumin: 4.4 g/dL (ref 3.5–5.0)
Alkaline Phosphatase: 64 U/L (ref 38–126)
Anion gap: 11 (ref 5–15)
BUN: 10 mg/dL (ref 6–20)
CO2: 25 mmol/L (ref 22–32)
Calcium: 9.2 mg/dL (ref 8.9–10.3)
Chloride: 103 mmol/L (ref 98–111)
Creatinine, Ser: 0.64 mg/dL (ref 0.44–1.00)
GFR calc Af Amer: >60 mL/min (ref >60)
GFR calc non Af Amer: >60 mL/min (ref >60)
Glucose, Bld: 105 mg/dL — ABNORMAL HIGH (ref 70–99)
Potassium: 3.1 mmol/L — ABNORMAL LOW (ref 3.5–5.1)
Sodium: 139 mmol/L (ref 135–145)
Total Bilirubin: 0.5 mg/dL (ref 0.3–1.2)
Total Protein: 7.4 g/dL (ref 6.5–8.1)

## 2020-04-07 LAB — URINALYSIS, COMPLETE (UACMP) WITH MICROSCOPIC
Bilirubin Urine: NEGATIVE
Glucose, UA: NEGATIVE mg/dL
Hgb urine dipstick: NEGATIVE
Ketones, ur: 5 mg/dL — AB
Nitrite: NEGATIVE
Protein, ur: 30 mg/dL — AB
Specific Gravity, Urine: 1.018 (ref 1.005–1.030)
pH: 6 (ref 5.0–8.0)

## 2020-04-07 LAB — CBC
HCT: 42.2 % (ref 36.0–46.0)
Hemoglobin: 14.7 g/dL (ref 12.0–15.0)
MCH: 32.3 pg (ref 26.0–34.0)
MCHC: 34.8 g/dL (ref 30.0–36.0)
MCV: 92.7 fL (ref 80.0–100.0)
Platelets: 200 10*3/uL (ref 150–400)
RBC: 4.55 MIL/uL (ref 3.87–5.11)
RDW: 11.7 % (ref 11.5–15.5)
WBC: 14.8 10*3/uL — ABNORMAL HIGH (ref 4.0–10.5)
nRBC: 0 % (ref 0.0–0.2)

## 2020-04-07 LAB — POCT PREGNANCY, URINE: Preg Test, Ur: NEGATIVE

## 2020-04-07 LAB — LIPASE, BLOOD: Lipase: 24 U/L (ref 11–51)

## 2020-04-07 MED ORDER — FENTANYL CITRATE (PF) 100 MCG/2ML IJ SOLN
100.0000 ug | Freq: Once | INTRAMUSCULAR | Status: AC
  Administered 2020-04-07: 100 ug via INTRAVENOUS
  Filled 2020-04-07: qty 2

## 2020-04-07 MED ORDER — MORPHINE SULFATE (PF) 4 MG/ML IV SOLN
4.0000 mg | Freq: Once | INTRAVENOUS | Status: AC
  Administered 2020-04-07: 4 mg via INTRAVENOUS
  Filled 2020-04-07: qty 1

## 2020-04-07 MED ORDER — CEPHALEXIN 500 MG PO CAPS
500.0000 mg | ORAL_CAPSULE | Freq: Two times a day (BID) | ORAL | 0 refills | Status: DC
Start: 2020-04-07 — End: 2021-01-28

## 2020-04-07 MED ORDER — SODIUM CHLORIDE 0.9 % IV SOLN
1.0000 g | Freq: Once | INTRAVENOUS | Status: AC
  Administered 2020-04-07: 1 g via INTRAVENOUS
  Filled 2020-04-07: qty 10

## 2020-04-07 MED ORDER — OXYCODONE-ACETAMINOPHEN 5-325 MG PO TABS
2.0000 | ORAL_TABLET | Freq: Once | ORAL | Status: AC
  Administered 2020-04-07: 2 via ORAL
  Filled 2020-04-07: qty 2

## 2020-04-07 MED ORDER — ONDANSETRON 4 MG PO TBDP
4.0000 mg | ORAL_TABLET | Freq: Once | ORAL | Status: DC | PRN
  Filled 2020-04-07: qty 1

## 2020-04-07 MED ORDER — SODIUM CHLORIDE 0.9 % IV BOLUS
1000.0000 mL | Freq: Once | INTRAVENOUS | Status: AC
  Administered 2020-04-07: 1000 mL via INTRAVENOUS

## 2020-04-07 MED ORDER — PROMETHAZINE HCL 25 MG/ML IJ SOLN
12.5000 mg | Freq: Once | INTRAMUSCULAR | Status: AC
  Administered 2020-04-07: 12.5 mg via INTRAVENOUS

## 2020-04-07 MED ORDER — PROMETHAZINE HCL 25 MG PO TABS
25.0000 mg | ORAL_TABLET | Freq: Four times a day (QID) | ORAL | 0 refills | Status: DC | PRN
Start: 2020-04-07 — End: 2021-01-28

## 2020-04-07 NOTE — ED Provider Notes (Signed)
[facility] Emergency Department Provider Note ____________________________________________   First MD Initiated Contact with Patient 04/07/20 1406     (approximate)  I have reviewed the triage vital signs and the nursing notes.   HISTORY  Chief Complaint Abdominal Pain  HPI Anita [name] is a 29 y.o. female who presents to the emergency department for treatment and evaluation of back and abdominal pain.  Pain started several days ago.  She was evaluated in the ER at [facility] and diagnosed with acute cystitis.  She was started on Keflex and Zofran.  Patient states that she was only able to tolerate 3 of the pills because they made her vomit.  Back pain and abdominal pain have progressed.  No fever.         Past Medical History:  Diagnosis Date  . Anemia    history  . Depression   . History of chlamydia   . Hormone disorder   . Migraine without aura   . PCOS (polycystic ovarian syndrome)   . Vitamin D deficiency     Patient Active Problem List   Diagnosis Date Noted  . History of chlamydia   . Migraine without aura     Past Surgical History:  Procedure Laterality Date  . BREAST LUMPECTOMY Right 09/23/2018   Procedure: RIGHT BREAST LUMPECTOMY ERAS PATHWAY;  Surgeon: Coralie Keens, MD;  Location: [facility];  Service: General;  Laterality: Right;  . COLPOSCOPY    . MOUTH SURGERY      Prior to Admission medications   Medication Sig Start Date End Date Taking? Authorizing Provider  cephALEXin (KEFLEX) 500 MG capsule Take 1 capsule (500 mg total) by mouth 2 (two) times daily. 04/07/20   Kendy Haston B, FNP  meloxicam (MOBIC) 7.5 MG tablet Take 1 tablet (7.5 mg total) by mouth daily. 09/03/18   Tasia Catchings, Amy V, PA-C  ondansetron (ZOFRAN ODT) 4 MG disintegrating tablet Take 1 tablet (4 mg total) by mouth every 8 (eight) hours as needed for nausea or vomiting. 04/05/20   Suzy Bouchard, PA-C  oxycodone (OXY-IR) 5 MG capsule  Take 1 capsule (5 mg total) by mouth every 6 (six) hours as needed for pain. 09/23/18   Coralie Keens, MD  promethazine (PHENERGAN) 25 MG tablet Take 1 tablet (25 mg total) by mouth every 6 (six) hours as needed for nausea or vomiting. 04/07/20   Tenesha Garza, Dessa Phi, FNP    Allergies Doxycycline and E-mycin [erythromycin]  Family History  Problem Relation Age of Onset  . Fibroids Mother     Social History Social History   Tobacco Use  . Smoking status: Never Smoker  . Smokeless tobacco: Never Used  Substance Use Topics  . Alcohol use: Yes  . Drug use: Yes    Types: Marijuana    Comment: smokes daily socially    Review of Systems  Constitutional: No fever/chills Eyes: No visual changes. ENT: No sore throat. Cardiovascular: Denies chest pain. Respiratory: Denies shortness of breath. Gastrointestinal: Positive for abdominal pain.  Positive for nausea and vomiting.  No diarrhea.  No constipation. Genitourinary: Negative for dysuria.  Musculoskeletal: Positive for back pain.  No CVA tenderness Skin: Negative for rash. Neurological: Negative for headaches, focal weakness or numbness. ____________________________________________   PHYSICAL EXAM:  VITAL SIGNS: ED Triage Vitals [04/07/20 1259]  Enc Vitals Group     BP 115/70     Pulse Rate 61     Resp 18     Temp  98.3 F (36.8 C)     Temp Source Oral     SpO2 100 %     Weight 149 lb 14.6 oz (68 kg)     Height 5\' 4"  (1.626 m)     Head Circumference      Peak Flow      Pain Score 8     Pain Loc      Pain Edu?      Excl. in [place]?     Constitutional: Alert and oriented. Well appearing and in no acute distress. Eyes: Conjunctivae are normal. PERRL. EOMI. Head: Atraumatic. Nose: No congestion/rhinnorhea. Mouth/Throat: Mucous membranes are moist.  Oropharynx non-erythematous. Neck: No stridor.   Hematological/Lymphatic/Immunilogical: No cervical lymphadenopathy. Cardiovascular: Normal rate, regular rhythm. Grossly  normal heart sounds.  Good peripheral circulation. Respiratory: Normal respiratory effort.  No retractions. Lungs CTAB. Gastrointestinal: Soft and nontender. No distention. No abdominal bruits. No CVA tenderness. Genitourinary:  Musculoskeletal: No lower extremity tenderness nor edema.  No joint effusions. Neurologic:  Normal speech and language. No gross focal neurologic deficits are appreciated. No gait instability. Skin:  Skin is warm, dry and intact. No rash noted. Psychiatric: Mood and affect are normal. Speech and behavior are normal.  ____________________________________________   LABS (all labs ordered are listed, but only abnormal results are displayed)  Labs Reviewed  COMPREHENSIVE METABOLIC PANEL - Abnormal; Notable for the following components:      Result Value   Potassium 3.1 (*)    Glucose, Bld 105 (*)    All other components within normal limits  CBC - Abnormal; Notable for the following components:   WBC 14.8 (*)    All other components within normal limits  URINALYSIS, COMPLETE (UACMP) WITH MICROSCOPIC - Abnormal; Notable for the following components:   Color, Urine YELLOW (*)    APPearance CLOUDY (*)    Ketones, ur 5 (*)    Protein, ur 30 (*)    Leukocytes,Ua SMALL (*)    Bacteria, UA RARE (*)    All other components within normal limits  LIPASE, BLOOD  POC URINE PREG, ED  POCT PREGNANCY, URINE   ____________________________________________  EKG  Not indicated ____________________________________________  RADIOLOGY  ED MD interpretation:    CT negative for any acute findings.  There is possible sludge or stones in the gallbladder without any wall thickening or pericholecystic fluid  Official radiology report(s): CT Renal Stone Study  Result Date: 04/07/2020 CLINICAL DATA:  Nausea and vomiting.  Flank pain. EXAM: CT ABDOMEN AND PELVIS WITHOUT CONTRAST TECHNIQUE: Multidetector CT imaging of the abdomen and pelvis was performed following the standard  protocol without IV contrast. COMPARISON:  Apr 05, 2020 FINDINGS: Lower chest: No acute abnormality. Hepatobiliary: High attenuation in the posterior gallbladder could represent sludge or stones. No wall thickening or pericholecystic fluid. The liver is unremarkable. Pancreas: Unremarkable. No pancreatic ductal dilatation or surrounding inflammatory changes. Spleen: Normal in size without focal abnormality. Adrenals/Urinary Tract: Adrenal glands are unremarkable. Kidneys are normal, without renal calculi, focal lesion, or hydronephrosis. Bladder is unremarkable. Stomach/Bowel: Stomach is within normal limits. Appendix appears normal. No evidence of bowel wall thickening, distention, or inflammatory changes. Vascular/Lymphatic: No significant vascular findings are present. No enlarged abdominal or pelvic lymph nodes. Reproductive: Uterus and bilateral adnexa are unremarkable. Other: No abdominal wall hernia or abnormality. No abdominopelvic ascites. Musculoskeletal: No acute or significant osseous findings. IMPRESSION: 1. No acute abnormalities identified. 2. Possible sludge or stones in the gallbladder without wall thickening or pericholecystic fluid. Electronically Signed  By: Dorise Bullion III M.D   On: 04/07/2020 17:45    ____________________________________________   PROCEDURES  Procedure(s) performed (including Critical Care):  Procedures  ____________________________________________   INITIAL IMPRESSION / ASSESSMENT AND PLAN     29 year old female presenting to the emergency department for treatment and evaluation of back and abdominal pain that have progressively worsened over the past few days.  See HPI for further details.  Results of the urinalysis are still pending.  She does have a mild leukocytosis at 14.8.  Hypokalemia noted at 3.1 but the remainder of the CMP is normal.  DIFFERENTIAL DIAGNOSIS  Acute cystitis, pyelonephritis, ureterolithiasis, nephrolithiasis, muscle strain,  PID  ED COURSE  Urinalysis is indicative of acute cystitis but today's sample does not have any nitrates.  She still has small leukocytes, 11-20 white blood cells, rare bacteria.  She does have 6-10 red blood cells but negative for hemoglobin.  Hyaline casts are present.  Ceftriaxone and fentanyl with Zofran ordered.  Discussed possibility of kidney stones and patient would like to proceed with CT scan.  CT for renal stone shows no stone disease.  Patient to be discharged home.  Keflex initially dosed at 4 times per day.  It will be decreased to twice a day and she will be given Phenergan instead of Zofran which should help with her nausea.  RN staff reports that the patient refuses to leave.  She states that "we did not do anything today that was not done the other day."  Will discuss with attending to ensure that no additional testing or medications are indicated.  Dr. Jimmye Norman discussed discharge with the patient. ____________________________________________   FINAL CLINICAL IMPRESSION(S) / ED DIAGNOSES  Final diagnoses:  Acute cystitis without hematuria     ED Discharge Orders         Ordered    cephALEXin (KEFLEX) 500 MG capsule  2 times daily     04/07/20 1802    promethazine (PHENERGAN) 25 MG tablet  Every 6 hours PRN     04/07/20 1802           CURLEY GRGAS was evaluated in Emergency Department on 04/07/2020 for the symptoms described in the history of present illness. She was evaluated in the context of the global COVID-19 pandemic, which necessitated consideration that the patient might be at risk for infection with the SARS-CoV-2 virus that causes COVID-19. Institutional protocols and algorithms that pertain to the evaluation of patients at risk for COVID-19 are in a state of rapid change based on information released by regulatory bodies including the CDC and federal and state organizations. These policies and algorithms were followed during the patient's care in the  ED.   Note:  This document was prepared using Dragon voice recognition software and may include unintentional dictation errors.   Victorino Dike, FNP 04/07/20 2006    Earleen Newport, MD 04/07/20 2306

## 2020-04-07 NOTE — ED Notes (Signed)
Attempting to discharge pt. Pt states she is too nauseated to leave. Pt continues to state she cannot hold down po fluids. md notified, order for iv phenergan received.

## 2020-04-07 NOTE — ED Notes (Signed)
Pt refusing oral zofran

## 2020-04-07 NOTE — Discharge Instructions (Signed)
Your CT scan does not indicate kidney stones.  You will need to take the antibiotic.  A new prescription has been written.  I have also written a prescription for Phenergan.  This is a stronger nausea medicine but it will make you sleepy.  Do not take Phenergan if you need to work or drive.  Follow-up with your primary care provider or return to the emergency department for symptoms of change or worsen.

## 2020-04-07 NOTE — ED Notes (Signed)
Went in to discharge pt per dr. Jimmye Norman. Pt states "no one is doing anything for me". Pt states she was supposed to received pain medication and more ivf. Pt states "i'm burning up with a fever right now". Pt with 99.0 temp. Will notify dr. Jimmye Norman. Pt notified will speak with dr. Jimmye Norman.

## 2020-04-07 NOTE — ED Triage Notes (Signed)
Pt comes POV after n/v for 2 days. Pt states that she was recently admitted here for same. Unable to keep nausea meds down. Pt balled up in chair.

## 2020-04-07 NOTE — ED Notes (Signed)
Pt calling out in lobby bathroom, pt sitting on floor, pt had clear yellow emesis in toilet. Pt asking for help up. I brought in a wheelchair for patient and she was able to get herself into the wheelchair without help.  Pt escorted back to lobby and given emesis bag.

## 2020-04-07 NOTE — ED Notes (Signed)
In to discharge pt. Pt voiced concerns that we had not done anything different than last time she was here. Provider notified and will see pt.

## 2020-05-20 ENCOUNTER — Other Ambulatory Visit: Payer: Self-pay

## 2020-05-20 ENCOUNTER — Emergency Department
Admission: EM | Admit: 2020-05-20 | Discharge: 2020-05-20 | Disposition: A | Discharge status: Home / Self Care | Payer: Medicaid Other | Attending: Emergency Medicine | Admitting: Emergency Medicine

## 2020-05-20 ENCOUNTER — Emergency Department: Payer: Medicaid Other

## 2020-05-20 ENCOUNTER — Encounter: Payer: Self-pay | Admitting: Emergency Medicine

## 2020-05-20 DIAGNOSIS — A5901 Trichomonal vulvovaginitis: Secondary | ICD-10-CM | POA: Insufficient documentation

## 2020-05-20 DIAGNOSIS — B9689 Other specified bacterial agents as the cause of diseases classified elsewhere: Secondary | ICD-10-CM | POA: Insufficient documentation

## 2020-05-20 DIAGNOSIS — N760 Acute vaginitis: Secondary | ICD-10-CM | POA: Insufficient documentation

## 2020-05-20 DIAGNOSIS — R109 Unspecified abdominal pain: Secondary | ICD-10-CM | POA: Insufficient documentation

## 2020-05-20 DIAGNOSIS — A5909 Other urogenital trichomoniasis: Secondary | ICD-10-CM

## 2020-05-20 LAB — COMPREHENSIVE METABOLIC PANEL
ALT: 24 U/L (ref 0–44)
AST: 25 U/L (ref 15–41)
Albumin: 5.1 g/dL — ABNORMAL HIGH (ref 3.5–5.0)
Alkaline Phosphatase: 82 U/L (ref 38–126)
Anion gap: 13 (ref 5–15)
BUN: 21 mg/dL — ABNORMAL HIGH (ref 6–20)
CO2: 23 mmol/L (ref 22–32)
Calcium: 9.7 mg/dL (ref 8.9–10.3)
Chloride: 98 mmol/L (ref 98–111)
Creatinine, Ser: 0.79 mg/dL (ref 0.44–1.00)
GFR calc Af Amer: >60 mL/min (ref >60)
GFR calc non Af Amer: >60 mL/min (ref >60)
Glucose, Bld: 106 mg/dL — ABNORMAL HIGH (ref 70–99)
Potassium: 3.1 mmol/L — ABNORMAL LOW (ref 3.5–5.1)
Sodium: 134 mmol/L — ABNORMAL LOW (ref 135–145)
Total Bilirubin: 1.2 mg/dL (ref 0.3–1.2)
Total Protein: 8.7 g/dL — ABNORMAL HIGH (ref 6.5–8.1)

## 2020-05-20 LAB — CBC
HCT: 45.7 % (ref 36.0–46.0)
Hemoglobin: 16 g/dL — ABNORMAL HIGH (ref 12.0–15.0)
MCH: 31.4 pg (ref 26.0–34.0)
MCHC: 35 g/dL (ref 30.0–36.0)
MCV: 89.8 fL (ref 80.0–100.0)
Platelets: 212 10*3/uL (ref 150–400)
RBC: 5.09 MIL/uL (ref 3.87–5.11)
RDW: 11.9 % (ref 11.5–15.5)
WBC: 15.6 10*3/uL — ABNORMAL HIGH (ref 4.0–10.5)
nRBC: 0 % (ref 0.0–0.2)

## 2020-05-20 LAB — WET PREP, GENITAL
Sperm: NONE SEEN
Yeast Wet Prep HPF POC: NONE SEEN

## 2020-05-20 LAB — LIPASE, BLOOD: Lipase: 37 U/L (ref 11–51)

## 2020-05-20 LAB — HCG, QUANTITATIVE, PREGNANCY: hCG, Beta Chain, Quant, S: <1 m[IU]/mL (ref <5)

## 2020-05-20 MED ORDER — KETOROLAC TROMETHAMINE 30 MG/ML IJ SOLN
30.0000 mg | Freq: Once | INTRAMUSCULAR | Status: AC
  Administered 2020-05-20: 30 mg via INTRAVENOUS

## 2020-05-20 MED ORDER — ONDANSETRON 4 MG PO TBDP
4.0000 mg | ORAL_TABLET | Freq: Once | ORAL | Status: DC | PRN
  Filled 2020-05-20: qty 1

## 2020-05-20 MED ORDER — KETOROLAC TROMETHAMINE 30 MG/ML IJ SOLN
INTRAMUSCULAR | Status: AC
  Filled 2020-05-20: qty 1

## 2020-05-20 MED ORDER — PROMETHAZINE HCL 25 MG/ML IJ SOLN
12.5000 mg | Freq: Once | INTRAMUSCULAR | Status: AC
  Administered 2020-05-20: 12.5 mg via INTRAVENOUS
  Filled 2020-05-20: qty 1

## 2020-05-20 MED ORDER — MORPHINE SULFATE (PF) 4 MG/ML IV SOLN
4.0000 mg | Freq: Once | INTRAVENOUS | Status: AC
  Administered 2020-05-20: 4 mg via INTRAVENOUS
  Filled 2020-05-20: qty 1

## 2020-05-20 MED ORDER — IOHEXOL 300 MG/ML  SOLN
100.0000 mL | Freq: Once | INTRAMUSCULAR | Status: AC | PRN
  Administered 2020-05-20: 100 mL via INTRAVENOUS
  Filled 2020-05-20: qty 100

## 2020-05-20 MED ORDER — TRAMADOL HCL 50 MG PO TABS: 50.0000 mg | ORAL_TABLET | Freq: Four times a day (QID) | ORAL | 0 refills | Status: DC | PRN

## 2020-05-20 MED ORDER — METRONIDAZOLE 500 MG PO TABS
500.0000 mg | ORAL_TABLET | Freq: Two times a day (BID) | ORAL | 0 refills | Status: DC
Start: 2020-05-20 — End: 2020-10-15

## 2020-05-20 NOTE — ED Provider Notes (Signed)
[name] Emergency Department Provider Note   ____________________________________________    I have reviewed the triage vital signs and the nursing notes.   HISTORY  Chief Complaint Emesis     HPI Anita [name] is a 29 y.o. female with history as noted below who presents with complaints of lower abdominal pain which she describes as cramping in intensity and moderate.  She denies fevers or chills.  She did notice vaginal discharge today.  Has had some nausea as well.  Does not take anything for this.  Denies similar symptoms in the past.  No recent travel.  No diarrhea.  No dysuria  Past Medical History:  Diagnosis Date  . Anemia    history  . Depression   . History of chlamydia   . Hormone disorder   . Migraine without aura   . PCOS (polycystic ovarian syndrome)   . Vitamin D deficiency     Patient Active Problem List   Diagnosis Date Noted  . History of chlamydia   . Migraine without aura     Past Surgical History:  Procedure Laterality Date  . BREAST LUMPECTOMY Right 09/23/2018   Procedure: RIGHT BREAST LUMPECTOMY ERAS PATHWAY;  Surgeon: Coralie Keens, MD;  Location: [place];  Service: General;  Laterality: Right;  . COLPOSCOPY    . MOUTH SURGERY      Prior to Admission medications   Medication Sig Start Date End Date Taking? Authorizing Provider  cephALEXin (KEFLEX) 500 MG capsule Take 1 capsule (500 mg total) by mouth 2 (two) times daily. 04/07/20   Triplett, Cari B, FNP  meloxicam (MOBIC) 7.5 MG tablet Take 1 tablet (7.5 mg total) by mouth daily. 09/03/18   Tasia Catchings, Amy V, PA-C  metroNIDAZOLE (FLAGYL) 500 MG tablet Take 1 tablet (500 mg total) by mouth 2 (two) times daily after a meal. 05/20/20   Lavonia Drafts, MD  ondansetron (ZOFRAN ODT) 4 MG disintegrating tablet Take 1 tablet (4 mg total) by mouth every 8 (eight) hours as needed for nausea or vomiting. 04/05/20   Suzy Bouchard, PA-C  oxycodone  (OXY-IR) 5 MG capsule Take 1 capsule (5 mg total) by mouth every 6 (six) hours as needed for pain. 09/23/18   Coralie Keens, MD  promethazine (PHENERGAN) 25 MG tablet Take 1 tablet (25 mg total) by mouth every 6 (six) hours as needed for nausea or vomiting. 04/07/20   Triplett, Cari B, FNP  traMADol (ULTRAM) 50 MG tablet Take 1 tablet (50 mg total) by mouth every 6 (six) hours as needed. 05/20/20 05/20/21  Lavonia Drafts, MD     Allergies E-mycin [erythromycin] and Doxycycline  Family History  Problem Relation Age of Onset  . Fibroids Mother     Social History Social History   Tobacco Use  . Smoking status: Never Smoker  . Smokeless tobacco: Never Used  Vaping Use  . Vaping Use: Never used  Substance Use Topics  . Alcohol use: Yes  . Drug use: Yes    Types: Marijuana    Comment: Daily    Review of Systems  Constitutional: No fever/chills Eyes: No visual changes.  ENT: No sore throat. Cardiovascular: Denies chest pain. Respiratory: Denies shortness of breath. Gastrointestinal: As above Genitourinary: As Musculoskeletal: Negative for back pain. Skin: Negative for rash. Neurological: Negative for headaches or weakness   ____________________________________________   PHYSICAL EXAM:  VITAL SIGNS: ED Triage Vitals  Enc Vitals Group     BP 05/20/20 1240 128/89  Pulse Rate 05/20/20 1240 70     Resp 05/20/20 1240 15     Temp 05/20/20 1240 97.9 F (36.6 C)     Temp Source 05/20/20 1240 Oral     SpO2 05/20/20 1240 98 %     Weight 05/20/20 1241 56.7 kg (125 lb)     Height 05/20/20 1241 1.626 m (5\' 4" )     Head Circumference --      Peak Flow --      Pain Score 05/20/20 1240 10     Pain Loc --      Pain Edu? --      Excl. in [place]? --     Constitutional: Alert and oriented. . Nose: No congestion/rhinnorhea. Mouth/Throat: Mucous membranes are moist.    Cardiovascular: Normal rate, regular rhythm. Grossly normal heart sounds.  Good peripheral  circulation. Respiratory: Normal respiratory effort.  No retractions. Lungs CTAB. Gastrointestinal: Soft and nontender. No distention.  No CVA tenderness. Genitourinary: Whitish discharge noted with mild cervicitis no CMT Musculoskeletal:  Warm and well perfused Neurologic:  Normal speech and language. No gross focal neurologic deficits are appreciated.  Skin:  Skin is warm, dry and intact. No rash noted. Psychiatric: Mood and affect are normal. Speech and behavior are normal.  ____________________________________________   LABS (all labs ordered are listed, but only abnormal results are displayed)  Labs Reviewed  WET PREP, GENITAL - Abnormal; Notable for the following components:      Result Value   Trich, Wet Prep PRESENT (*)    Clue Cells Wet Prep HPF POC PRESENT (*)    WBC, Wet Prep HPF POC RARE (*)    All other components within normal limits  COMPREHENSIVE METABOLIC PANEL - Abnormal; Notable for the following components:   Sodium 134 (*)    Potassium 3.1 (*)    Glucose, Bld 106 (*)    BUN 21 (*)    Total Protein 8.7 (*)    Albumin 5.1 (*)    All other components within normal limits  CBC - Abnormal; Notable for the following components:   WBC 15.6 (*)    Hemoglobin 16.0 (*)    All other components within normal limits  LIPASE, BLOOD  HCG, QUANTITATIVE, PREGNANCY  URINALYSIS, COMPLETE (UACMP) WITH MICROSCOPIC  POC URINE PREG, ED   ____________________________________________  EKG  None ____________________________________________  RADIOLOGY  CT abdomen pelvis is unremarkable ____________________________________________   PROCEDURES  Procedure(s) performed: No  Procedures   Critical Care performed: No ____________________________________________   INITIAL IMPRESSION / ASSESSMENT AND PLAN / ED COURSE  Pertinent labs & imaging results that were available during my care of the patient were reviewed by me and considered in my medical decision making  (see chart for details).  Patient presents with lower abdominal discomfort as noted above.  Differential includes appendicitis, cervicitis, PID  Lab work is notable for elevated white blood cell count of 15.6 however patient appears to have a chronically elevated white blood cell count.  Urine pregnancy test is negative.  Otherwise lab work is reassuring.  CT scan is negative for appendicitis  On gynecologic exam patient with whitish discharge wet prep positive for BV and trichomonas, GC chlamydia still pending.  Will treat with Flagyl, outpatient follow-up recommended.    ____________________________________________   FINAL CLINICAL IMPRESSION(S) / ED DIAGNOSES  Final diagnoses:  Trichomonal cervicitis  Bacterial vaginosis        Note:  This document was prepared using Dragon voice recognition software and may include unintentional  dictation errors.   Lavonia Drafts, MD 05/20/20 2208

## 2020-05-20 NOTE — ED Notes (Signed)
Patient transported to CT 

## 2020-05-20 NOTE — ED Notes (Signed)
Pt states she is unable to provide urine specimen at this time; states, "I havent drank anything."

## 2020-05-20 NOTE — ED Triage Notes (Signed)
Pt in via POV, reports N/V x 4 days w/ abdominal pain.  Denies pregnancy.  Vitals WDL, NAD noted at this time.

## 2020-05-20 NOTE — ED Notes (Signed)
Patient given warm blankets.

## 2020-05-20 NOTE — ED Notes (Signed)
E-signature not working at this time. Pt verbalized understanding of D/C instructions, prescriptions and follow up care with no further questions at this time. Pt in NAD and ambulatory at time of D/C.  

## 2020-05-27 ENCOUNTER — Emergency Department (HOSPITAL_COMMUNITY)
Admission: EM | Admit: 2020-05-27 | Discharge: 2020-05-27 | Disposition: A | Discharge status: Home / Self Care | Payer: Self-pay | Attending: Emergency Medicine | Admitting: Emergency Medicine

## 2020-05-27 ENCOUNTER — Other Ambulatory Visit: Payer: Self-pay

## 2020-05-27 ENCOUNTER — Encounter (HOSPITAL_COMMUNITY): Payer: Self-pay

## 2020-05-27 ENCOUNTER — Emergency Department (HOSPITAL_COMMUNITY): Payer: Self-pay

## 2020-05-27 DIAGNOSIS — W19XXXA Unspecified fall, initial encounter: Secondary | ICD-10-CM

## 2020-05-27 DIAGNOSIS — Y999 Unspecified external cause status: Secondary | ICD-10-CM | POA: Insufficient documentation

## 2020-05-27 DIAGNOSIS — W1830XA Fall on same level, unspecified, initial encounter: Secondary | ICD-10-CM | POA: Insufficient documentation

## 2020-05-27 DIAGNOSIS — Y9389 Activity, other specified: Secondary | ICD-10-CM | POA: Insufficient documentation

## 2020-05-27 DIAGNOSIS — Y9289 Other specified places as the place of occurrence of the external cause: Secondary | ICD-10-CM | POA: Insufficient documentation

## 2020-05-27 DIAGNOSIS — M79601 Pain in right arm: Secondary | ICD-10-CM | POA: Insufficient documentation

## 2020-05-27 DIAGNOSIS — M25561 Pain in right knee: Secondary | ICD-10-CM | POA: Insufficient documentation

## 2020-05-27 MED ORDER — NAPROXEN 500 MG PO TABS
500.0000 mg | ORAL_TABLET | Freq: Two times a day (BID) | ORAL | 0 refills | Status: DC
Start: 2020-05-27 — End: 2021-01-28

## 2020-05-27 MED ORDER — HYDROCODONE-ACETAMINOPHEN 5-325 MG PO TABS
1.0000 | ORAL_TABLET | Freq: Once | ORAL | Status: AC
  Administered 2020-05-27: 1 via ORAL
  Filled 2020-05-27: qty 1

## 2020-05-27 MED ORDER — LIDOCAINE 5 % EX PTCH: 1.0000 | MEDICATED_PATCH | CUTANEOUS | 0 refills | Status: DC

## 2020-05-27 MED ORDER — HYDROCODONE-ACETAMINOPHEN 5-325 MG PO TABS: 1.0000 | ORAL_TABLET | ORAL | 0 refills | Status: DC | PRN

## 2020-05-27 NOTE — Discharge Instructions (Addendum)
Take the medications as prescribed.  Follow-up outpatient with orthopedics with Dr. Griffin Basil if your knee pain is not resolved.  Continue to take ibuprofen as well as some aspirin and warm compress to your arm.  Return for an ultrasound.  Return for new or worsening symptoms

## 2020-05-27 NOTE — ED Provider Notes (Addendum)
[name] DEPT Provider Note   CSN: [id number] Arrival date & time: 05/27/20  1615    History Chief Complaint  Patient presents with  . IV site burning  . Fall  . Knee Pain    Anita [name] is a 29 y.o. female with medical history significant for PCOS, anxiety, depression who presents for evaluation multiple complaints.  Patient with right arm burning sensation where she had IV placed approximate 1 week ago.  She feels this is swelling.  No overlying erythema or warmth.  Patient states she will occasionally have tingling sensation to 4 cm area to her arm.  Painful when she palpates the area.  No associated induration.  Intermittently been taking ibuprofen.  Patient also states she had a mechanical fall onto anterior aspect of her right knee 2 days ago.  Has had pain to the anterior aspect of her knee.  Has been continue to take ibuprofen.  No trauma.  She is amatory without difficulty.  Has some mild overlying swelling.  No overlying warmth, erythema.  Tetanus up-to-date.  She denies hitting her head, LOC, anticoagulation.  No headache, lightheadedness, dizziness, chest pain, shortness of breath abdominal pain, diarrhea, dysuria.  No lacerations to suture.  Rates pain a 10/10.  Denies additional aggravating or relieving factors.  History obtained from patient and past medical records.  No interpreter used.  HPI     Past Medical History:  Diagnosis Date  . Anemia    history  . Depression   . History of chlamydia   . Hormone disorder   . Migraine without aura   . PCOS (polycystic ovarian syndrome)   . Vitamin D deficiency     Patient Active Problem List   Diagnosis Date Noted  . History of chlamydia   . Migraine without aura     Past Surgical History:  Procedure Laterality Date  . BREAST LUMPECTOMY Right 09/23/2018   Procedure: RIGHT BREAST LUMPECTOMY ERAS PATHWAY;  Surgeon: Coralie Keens, MD;  Location: [facility];   Service: General;  Laterality: Right;  . COLPOSCOPY    . MOUTH SURGERY       OB History    Gravida  0   Para  0   Term  0   Preterm  0   AB  0   Living  0     SAB  0   TAB  0   Ectopic  0   Multiple  0   Live Births  0           Family History  Problem Relation Age of Onset  . Fibroids Mother     Social History   Tobacco Use  . Smoking status: Never Smoker  . Smokeless tobacco: Never Used  Vaping Use  . Vaping Use: Never used  Substance Use Topics  . Alcohol use: Yes  . Drug use: Yes    Types: Marijuana    Comment: Daily    Home Medications Prior to Admission medications   Medication Sig Start Date End Date Taking? Authorizing Provider  cephALEXin (KEFLEX) 500 MG capsule Take 1 capsule (500 mg total) by mouth 2 (two) times daily. 04/07/20   Triplett, Johnette Abraham B, FNP  HYDROcodone-acetaminophen (NORCO/VICODIN) 5-325 MG tablet Take 1 tablet by mouth every 4 (four) hours as needed. 05/27/20   Majesta Leichter A, PA-C  lidocaine (LIDODERM) 5 % Place 1 patch onto the skin daily. Remove & Discard patch within 12 hours or as directed  by MD 05/27/20   Umaiza Matusik A, PA-C  meloxicam (MOBIC) 7.5 MG tablet Take 1 tablet (7.5 mg total) by mouth daily. 09/03/18   Tasia Catchings, Amy V, PA-C  metroNIDAZOLE (FLAGYL) 500 MG tablet Take 1 tablet (500 mg total) by mouth 2 (two) times daily after a meal. 05/20/20   Lavonia Drafts, MD  naproxen (NAPROSYN) 500 MG tablet Take 1 tablet (500 mg total) by mouth 2 (two) times daily. 05/27/20   Chael Urenda A, PA-C  ondansetron (ZOFRAN ODT) 4 MG disintegrating tablet Take 1 tablet (4 mg total) by mouth every 8 (eight) hours as needed for nausea or vomiting. 04/05/20   Suzy Bouchard, PA-C  oxycodone (OXY-IR) 5 MG capsule Take 1 capsule (5 mg total) by mouth every 6 (six) hours as needed for pain. 09/23/18   Coralie Keens, MD  promethazine (PHENERGAN) 25 MG tablet Take 1 tablet (25 mg total) by mouth every 6 (six) hours as needed for  nausea or vomiting. 04/07/20   Triplett, Cari B, FNP  traMADol (ULTRAM) 50 MG tablet Take 1 tablet (50 mg total) by mouth every 6 (six) hours as needed. 05/20/20 05/20/21  Lavonia Drafts, MD    Allergies    E-mycin [erythromycin] and Doxycycline  Review of Systems   Review of Systems  Constitutional: Negative.   HENT: Negative.   Respiratory: Negative.   Cardiovascular: Negative.   Genitourinary: Negative.   Musculoskeletal: Negative for arthralgias, back pain, gait problem, joint swelling, myalgias, neck pain and neck stiffness.       Right arm pain, right knee pain  Skin: Positive for wound.  Neurological: Negative.   All other systems reviewed and are negative.   Physical Exam Updated Vital Signs BP 128/84 (BP Location: Left Arm)   Pulse 84   Temp 99.1 F (37.3 C) (Oral)   Resp 14   Ht 5\' 4"  (1.626 m)   Wt 62.6 kg   SpO2 100%   BMI 23.69 kg/m   Physical Exam Vitals and nursing note reviewed.  Constitutional:      General: She is not in acute distress.    Appearance: She is well-developed. She is not ill-appearing, toxic-appearing or diaphoretic.  HENT:     Head: Normocephalic and atraumatic.     Nose: Nose normal.     Mouth/Throat:     Mouth: Mucous membranes are moist.  Eyes:     Pupils: Pupils are equal, round, and reactive to light.  Cardiovascular:     Rate and Rhythm: Normal rate.     Pulses: Normal pulses.          Dorsalis pedis pulses are 2+ on the right side and 2+ on the left side.     Heart sounds: Normal heart sounds. No friction rub.  Pulmonary:     Effort: Pulmonary effort is normal. No respiratory distress.     Breath sounds: Normal breath sounds.  Abdominal:     General: Abdomen is flat. Bowel sounds are normal. There is no distension.  Musculoskeletal:        General: Normal range of motion.     Right upper arm: Normal.     Left upper arm: Normal.     Right elbow: Normal.     Left elbow: Normal.     Right forearm: Tenderness present.      Left forearm: Normal.     Right wrist: Normal.     Left wrist: Normal.     Right hand: Normal.  Left hand: Normal.       Arms:     Cervical back: Normal range of motion.       Legs:     Comments: Tenderness palpation approximately 12 cm area to radial aspect right forearm.  No fluctuance or induration.  No overlying warmth.  Intact sensation.  Tenderness palpation to anterior right knee.  Able to straight leg raise without difficulty.  Able to flex and extend.  Negative anterior drawer, negative varus, valgus stress.  Overlying abrasion to right knee.  No obvious joint effusion.  No bony tenderness to bilateral femur, tibia or fibula.  Wiggles toes that difficulty.  No shortening or rotation of legs  Feet:     Right foot:     Skin integrity: Skin integrity normal.     Left foot:     Skin integrity: Skin integrity normal.  Skin:    General: Skin is warm and dry.     Capillary Refill: Capillary refill takes less than 2 seconds.     Comments: Abrasion overlying right patella.  No fluctuance or induration.  No edema, erythema warmth.  Tenderness radial aspect right forearm.  No fluctuance, induration, edema, erythema or warmth.  Neurological:     Mental Status: She is alert.     Cranial Nerves: Cranial nerves are intact.     Sensory: Sensation is intact.     Motor: Motor function is intact.     Coordination: Coordination is intact.     Gait: Gait is intact.     Comments: 5/5 strength bilateral upper and lower extremities Intact sensation     ED Results / Procedures / Treatments   Labs (all labs ordered are listed, but only abnormal results are displayed) Labs Reviewed - No data to display  EKG None  Radiology DG Knee Complete 4 Views Right  Result Date: 05/27/2020 CLINICAL DATA:  29 year old female with trauma to the right knee. EXAM: RIGHT KNEE - COMPLETE 4+ VIEW COMPARISON:  None. FINDINGS: No evidence of fracture, dislocation, or joint effusion. No evidence of arthropathy  or other focal bone abnormality. Soft tissues are unremarkable. IMPRESSION: Negative. Electronically Signed   By: Anner Crete M.D.   On: 05/27/2020 18:56    Procedures .Ortho Injury Treatment  Date/Time: 05/27/2020 10:11 PM Performed by: Shelby Dubin A, PA-C Authorized by: Nettie Elm, PA-C   Consent:    Consent obtained:  Verbal   Consent given by:  Patient   Risks discussed:  Fracture, nerve damage, restricted joint movement, vascular damage, stiffness, recurrent dislocation and irreducible dislocation   Alternatives discussed:  No treatment, alternative treatment, immobilization, referral and delayed treatmentInjury location: knee Location details: right knee Injury type: soft tissue Pre-procedure neurovascular assessment: neurovascularly intact Pre-procedure distal perfusion: normal Pre-procedure neurological function: normal Pre-procedure range of motion: normal  Anesthesia: Local anesthesia used: no  Patient sedated: NoImmobilization: splint Post-procedure neurovascular assessment: post-procedure neurovascularly intact Post-procedure distal perfusion: normal Post-procedure neurological function: normal Post-procedure range of motion: normal Patient tolerance: patient tolerated the procedure well with no immediate complications    (including critical care time)  Medications Ordered in ED Medications  HYDROcodone-acetaminophen (NORCO/VICODIN) 5-325 MG per tablet 1 tablet (1 tablet Oral Given 05/27/20 2203)    ED Course  I have reviewed the triage vital signs and the nursing notes.  Pertinent labs & imaging results that were available during my care of the patient were reviewed by me and considered in my medical decision making (see chart for details).  29 year old  female presents for evaluation 2 separate complaints.  Had a mechanical fall 2 days ago onto anterior right knee.  She has some overlying soft tissue swelling.  Negative anterior drawer, varus,  valgus stress.  Able to straight leg raise that difficulty.  She does have abrasion without overlying edema, erythema or warmth.  No obvious joint effusion.  Normal musculoskeletal exam.  Neurovascularly intact.  No bony tenderness to bilateral femur, tibia or fibula.  X-ray here does not show to do fracture, dislocation or effusion.  Placed in splint.  RICE for symptomatic management may follow-up outpatient with orthopedics if symptoms are unresolved.  Patient also with tenderness and tingling sensation to radial aspect right forearm.  Occurred after having IV placed 1 week ago.  She has some mild tenderness however is neurovascularly intact.  No fluctuance, induration, edema, erythema or warmth.  Has intact pulses.  No chest pain, shortness of breath.  Without tachycardia, tachypnea or hypoxia.  Area does not appear infected.  Suspect superficial thrombophlebitis.  Do not have ultrasound here in ED to rule out DVT however low suspicion for this.  Will place orders and have her return for this.  In meantime discussed symptomatic management for superficial thrombophlebitis.  Area does not appear cellulitic and does not have appearance of abscess.  Symptomatic management.  She will return for any worsening symptoms.  The patient has been appropriately medically screened and/or stabilized in the ED. I have low suspicion for any other emergent medical condition which would require further screening, evaluation or treatment in the ED or require inpatient management.  Patient is hemodynamically stable and in no acute distress.  Patient able to ambulate in department prior to ED.  Evaluation does not show acute pathology that would require ongoing or additional emergent interventions while in the emergency department or further inpatient treatment.  I have discussed the diagnosis with the patient and answered all questions.  Pain is been managed while in the emergency department and patient has no further  complaints prior to discharge.  Patient is comfortable with plan discussed in room and is stable for discharge at this time.  I have discussed strict return precautions for returning to the emergency department.  Patient was encouraged to follow-up with PCP/specialist refer to at discharge.    MDM Rules/Calculators/A&P                          MADIE CAHN was evaluated in Emergency Department on 05/27/2020 for the symptoms described in the history of present illness. She was evaluated in the context of the global COVID-19 pandemic, which necessitated consideration that the patient might be at risk for infection with the SARS-CoV-2 virus that causes COVID-19. Institutional protocols and algorithms that pertain to the evaluation of patients at risk for COVID-19 are in a state of rapid change based on information released by regulatory bodies including the CDC and federal and state organizations. These policies and algorithms were followed during the patient's care in the ED. Final Clinical Impression(s) / ED Diagnoses Final diagnoses:  Fall, initial encounter  Acute pain of right knee  Right arm pain    Rx / DC Orders ED Discharge Orders         Ordered    HYDROcodone-acetaminophen (NORCO/VICODIN) 5-325 MG tablet  Every 4 hours PRN     Discontinue  Reprint     05/27/20 2137    naproxen (NAPROSYN) 500 MG tablet  2 times daily  Discontinue  Reprint     05/27/20 2137    lidocaine (LIDODERM) 5 %  Every 24 hours     Discontinue  Reprint     05/27/20 2137    LE VENOUS        05/27/20 2138           Arden Tinoco A, PA-C 05/27/20 2154    Brace Welte A, PA-C 05/27/20 2212    Breck Coons, MD 05/27/20 2302
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
## 2020-05-28 ENCOUNTER — Other Ambulatory Visit (HOSPITAL_COMMUNITY): Payer: Self-pay | Admitting: Physician Assistant

## 2020-05-28 ENCOUNTER — Ambulatory Visit (HOSPITAL_COMMUNITY)
Admission: RE | Admit: 2020-05-28 | Discharge: 2020-05-28 | Disposition: A | Discharge status: Home / Self Care | Payer: Self-pay | Source: Ambulatory Visit | Attending: Physician Assistant | Admitting: Physician Assistant

## 2020-05-28 DIAGNOSIS — M79601 Pain in right arm: Secondary | ICD-10-CM | POA: Insufficient documentation

## 2020-05-28 DIAGNOSIS — M7989 Other specified soft tissue disorders: Secondary | ICD-10-CM | POA: Insufficient documentation

## 2020-10-15 ENCOUNTER — Encounter (HOSPITAL_COMMUNITY): Payer: Self-pay | Admitting: Emergency Medicine

## 2020-10-15 ENCOUNTER — Emergency Department (HOSPITAL_COMMUNITY)
Admission: EM | Admit: 2020-10-15 | Discharge: 2020-10-15 | Disposition: A | Discharge status: Home / Self Care | Payer: Medicaid Other | Attending: Emergency Medicine | Admitting: Emergency Medicine

## 2020-10-15 DIAGNOSIS — R102 Pelvic and perineal pain: Secondary | ICD-10-CM

## 2020-10-15 DIAGNOSIS — M545 Low back pain, unspecified: Secondary | ICD-10-CM

## 2020-10-15 DIAGNOSIS — N939 Abnormal uterine and vaginal bleeding, unspecified: Secondary | ICD-10-CM | POA: Insufficient documentation

## 2020-10-15 DIAGNOSIS — N898 Other specified noninflammatory disorders of vagina: Secondary | ICD-10-CM

## 2020-10-15 DIAGNOSIS — B9689 Other specified bacterial agents as the cause of diseases classified elsewhere: Secondary | ICD-10-CM

## 2020-10-15 DIAGNOSIS — N760 Acute vaginitis: Secondary | ICD-10-CM | POA: Insufficient documentation

## 2020-10-15 LAB — COMPREHENSIVE METABOLIC PANEL
ALT: 16 U/L (ref 0–44)
AST: 18 U/L (ref 15–41)
Albumin: 4.3 g/dL (ref 3.5–5.0)
Alkaline Phosphatase: 67 U/L (ref 38–126)
Anion gap: 10 (ref 5–15)
BUN: 7 mg/dL (ref 6–20)
CO2: 22 mmol/L (ref 22–32)
Calcium: 9.2 mg/dL (ref 8.9–10.3)
Chloride: 107 mmol/L (ref 98–111)
Creatinine, Ser: 0.58 mg/dL (ref 0.44–1.00)
GFR, Estimated: >60 mL/min (ref >60)
Glucose, Bld: 85 mg/dL (ref 70–99)
Potassium: 3.8 mmol/L (ref 3.5–5.1)
Sodium: 139 mmol/L (ref 135–145)
Total Bilirubin: 0.3 mg/dL (ref 0.3–1.2)
Total Protein: 7.1 g/dL (ref 6.5–8.1)

## 2020-10-15 LAB — CBC
HCT: 41.4 % (ref 36.0–46.0)
Hemoglobin: 13.4 g/dL (ref 12.0–15.0)
MCH: 31.2 pg (ref 26.0–34.0)
MCHC: 32.4 g/dL (ref 30.0–36.0)
MCV: 96.3 fL (ref 80.0–100.0)
Platelets: 187 10*3/uL (ref 150–400)
RBC: 4.3 MIL/uL (ref 3.87–5.11)
RDW: 12.5 % (ref 11.5–15.5)
WBC: 7.7 10*3/uL (ref 4.0–10.5)
nRBC: 0 % (ref 0.0–0.2)

## 2020-10-15 LAB — URINALYSIS, ROUTINE W REFLEX MICROSCOPIC
Bilirubin Urine: NEGATIVE
Glucose, UA: NEGATIVE mg/dL
Hgb urine dipstick: NEGATIVE
Ketones, ur: NEGATIVE mg/dL
Leukocytes,Ua: NEGATIVE
Nitrite: NEGATIVE
Protein, ur: NEGATIVE mg/dL
Specific Gravity, Urine: 1.012 (ref 1.005–1.030)
pH: 7 (ref 5.0–8.0)

## 2020-10-15 LAB — WET PREP, GENITAL
Sperm: NONE SEEN
Trich, Wet Prep: NONE SEEN
Yeast Wet Prep HPF POC: NONE SEEN

## 2020-10-15 LAB — I-STAT BETA HCG BLOOD, ED (MC, WL, AP ONLY): I-stat hCG, quantitative: <5 m[IU]/mL (ref <5)

## 2020-10-15 LAB — RAPID HIV SCREEN (HIV 1/2 AB+AG)
HIV 1/2 Antibodies: NONREACTIVE
HIV-1 P24 Antigen - HIV24: NONREACTIVE

## 2020-10-15 LAB — LIPASE, BLOOD: Lipase: 36 U/L (ref 11–51)

## 2020-10-15 MED ORDER — CEFTRIAXONE SODIUM 1 G IJ SOLR
500.0000 mg | Freq: Once | INTRAMUSCULAR | Status: AC
  Administered 2020-10-15: 500 mg via INTRAMUSCULAR
  Filled 2020-10-15: qty 10

## 2020-10-15 MED ORDER — LIDOCAINE HCL 1 % IJ SOLN
INTRAMUSCULAR | Status: AC
  Administered 2020-10-15: 20 mL
  Filled 2020-10-15: qty 20

## 2020-10-15 MED ORDER — AZITHROMYCIN 1 G PO PACK
1.0000 g | PACK | Freq: Once | ORAL | Status: AC
  Administered 2020-10-15: 1 g via ORAL
  Filled 2020-10-15: qty 1

## 2020-10-15 MED ORDER — PENICILLIN G BENZATHINE 1200000 UNIT/2ML IM SUSP
2.4000 10*6.[IU] | Freq: Once | INTRAMUSCULAR | Status: AC
  Administered 2020-10-15: 2.4 10*6.[IU] via INTRAMUSCULAR
  Filled 2020-10-15: qty 4

## 2020-10-15 MED ORDER — METRONIDAZOLE 500 MG PO TABS: 500.0000 mg | ORAL_TABLET | Freq: Two times a day (BID) | ORAL | 0 refills | Status: AC

## 2020-10-15 NOTE — Discharge Instructions (Addendum)
You were evaluated in the Emergency Department and after careful evaluation, we did not find any emergent condition requiring admission or further testing in the hospital.  Your exam/testing today was overall reassuring.  We are treating you for possible sexually transmitted diseases.  We are also treating you for bacterial vaginosis.  You will be called with any abnormal test results.  Please return to the Emergency Department if you experience any worsening of your condition.  Thank you for allowing [place] to be a part of your care.

## 2020-10-15 NOTE — ED Triage Notes (Signed)
Per pt, states she has been having abdominal pain for 2 weeks-states also having back pain and doesn't feel like she is emptying her bladder fully

## 2020-10-15 NOTE — ED Provider Notes (Signed)
[facility] Emergency Department Provider Note MRN:  [id number]  Arrival date & time: 10/15/20     Chief Complaint   Abdominal Pain and Back Pain   History of Present Illness   Anita [name] is a 29 y.o. year-old female with no pertinent past medical history presenting to the ED with chief complaint of abdominal pain.  Lower abdomen/pelvic pain with low back pain for the past week or 2.  Explains that she had 2 separate episodes of bleeding this month from the vagina which is abnormal for her.  Endorsing some vaginal discharge recently as well.  Denies fever.  Also endorsing some rash to her left leg, vaginal area, buttocks, back.  Review of Systems  A complete 10 system review of systems was obtained and all systems are negative except as noted in the HPI and PMH.   Patient's Health History    Past Medical History:  Diagnosis Date  . Anemia    history  . Depression   . History of chlamydia   . Hormone disorder   . Migraine without aura   . PCOS (polycystic ovarian syndrome)   . Vitamin D deficiency     Past Surgical History:  Procedure Laterality Date  . BREAST LUMPECTOMY Right 09/23/2018   Procedure: RIGHT BREAST LUMPECTOMY ERAS PATHWAY;  Surgeon: Coralie Keens, MD;  Location: [facility];  Service: General;  Laterality: Right;  . COLPOSCOPY    . MOUTH SURGERY      Family History  Problem Relation Age of Onset  . Fibroids Mother     Social History   Socioeconomic History  . Marital status: Single    Spouse name: Not on file  . Number of children: Not on file  . Years of education: Not on file  . Highest education level: Not on file  Occupational History  . Occupation: unemployed  Tobacco Use  . Smoking status: Never Smoker  . Smokeless tobacco: Never Used  Vaping Use  . Vaping Use: Never used  Substance and Sexual Activity  . Alcohol use: Yes  . Drug use: Yes    Types: Marijuana    Comment: Daily  . Sexual  activity: Yes    Partners: Male    Birth control/protection: None  Other Topics Concern  . Not on file  Social History Narrative   Lives alone   Social Determinants of Health   Financial Resource Strain:   . Difficulty of Paying Living Expenses: Not on file  Food Insecurity:   . Worried About Charity fundraiser in the Last Year: Not on file  . Ran Out of Food in the Last Year: Not on file  Transportation Needs:   . Lack of Transportation (Medical): Not on file  . Lack of Transportation (Non-Medical): Not on file  Physical Activity:   . Days of Exercise per Week: Not on file  . Minutes of Exercise per Session: Not on file  Stress:   . Feeling of Stress : Not on file  Social Connections:   . Frequency of Communication with Friends and Family: Not on file  . Frequency of Social Gatherings with Friends and Family: Not on file  . Attends Religious Services: Not on file  . Active Member of Clubs or Organizations: Not on file  . Attends Archivist Meetings: Not on file  . Marital Status: Not on file  Intimate Partner Violence:   . Fear of Current or Ex-Partner: Not on file  .  Emotionally Abused: Not on file  . Physically Abused: Not on file  . Sexually Abused: Not on file     Physical Exam   Vitals:   10/15/20 1030  BP: 119/82  Pulse: 70  Resp: 16  Temp: 98.4 F (36.9 C)  SpO2: 100%    CONSTITUTIONAL: Well-appearing, NAD NEURO:  Alert and oriented x 3, no focal deficits EYES:  eyes equal and reactive ENT/NECK:  no LAD, no JVD CARDIO: Regular rate, well-perfused, normal S1 and S2 PULM:  CTAB no wheezing or rhonchi GI/GU:  normal bowel sounds, non-distended, non-tender; normal appearing external genitalia, no lymphadenopathy, no cervical motion tenderness, no adnexal masses or tenderness, moderate amount of thick white discharge in the vaginal vault MSK/SPINE:  No gross deformities, no edema SKIN: Scattered excoriated macular rash to the left thigh, left  groin area PSYCH:  Appropriate speech and behavior  *Additional and/or pertinent findings included in MDM below  Diagnostic and Interventional Summary    EKG Interpretation  Date/Time:    Ventricular Rate:    PR Interval:    QRS Duration:   QT Interval:    QTC Calculation:   R Axis:     Text Interpretation:        Labs Reviewed  WET PREP, GENITAL - Abnormal; Notable for the following components:      Result Value   Clue Cells Wet Prep HPF POC PRESENT (*)    WBC, Wet Prep HPF POC FEW (*)    All other components within normal limits  LIPASE, BLOOD  COMPREHENSIVE METABOLIC PANEL  CBC  URINALYSIS, ROUTINE W REFLEX MICROSCOPIC  RPR  RAPID HIV SCREEN (HIV 1/2 AB+AG)  I-STAT BETA HCG BLOOD, ED ([facility], [facility], [facility] ONLY)  GC/CHLAMYDIA PROBE AMP ([place]) NOT AT [facility]    No orders to display    Medications  cefTRIAXone (ROCEPHIN) injection 500 mg (has no administration in time range)  penicillin g benzathine (BICILLIN LA) 1200000 UNIT/2ML injection 2.4 Million Units (has no administration in time range)  azithromycin (ZITHROMAX) powder 1 g (has no administration in time range)  lidocaine (XYLOCAINE) 1 % (with pres) injection (has no administration in time range)     Procedures  /  Critical Care Procedures  ED Course and Medical Decision Making  I have reviewed the triage vital signs, the nursing notes, and pertinent available records from the EMR.  Listed above are laboratory and imaging tests that I personally ordered, reviewed, and interpreted and then considered in my medical decision making (see below for details).  No McBurney's point tenderness, no abnormal vital signs, hCG is negative and so ectopic pregnancy is excluded.  With the vaginal discharge and rash there is concern for STD, possibly syphilis or HIV, which we will test for today.  Patient is empirically treated for gonorrhea, chlamydia, syphilis, appropriate for discharge on antibiotics for BV.       Barth Kirks. Sedonia Small, MD [place] [email or web address]  Final Clinical Impressions(s) / ED Diagnoses     ICD-10-CM   1. Pelvic pain  R10.2   2. Acute low back pain without sciatica, unspecified back pain laterality  M54.50   3. Abnormal uterine bleeding  N93.9   4. Vaginal discharge  N89.8   5. Bacterial vaginosis  N76.0    B96.89     ED Discharge Orders         Ordered    metroNIDAZOLE (FLAGYL) 500 MG tablet  2 times daily  10/15/20 1315           Discharge Instructions Discussed with and Provided to Patient:     Discharge Instructions     You were evaluated in the Emergency Department and after careful evaluation, we did not find any emergent condition requiring admission or further testing in the hospital.  Your exam/testing today was overall reassuring.  We are treating you for possible sexually transmitted diseases.  We are also treating you for bacterial vaginosis.  You will be called with any abnormal test results.  Please return to the Emergency Department if you experience any worsening of your condition.  Thank you for allowing [place] to be a part of your care.       Maudie Flakes, MD 10/15/20 1322

## 2020-10-16 LAB — RPR: RPR Ser Ql: NONREACTIVE

## 2020-10-17 LAB — GC/CHLAMYDIA PROBE AMP (~~LOC~~) NOT AT ARMC
Chlamydia: NEGATIVE
Comment: NEGATIVE
Comment: NORMAL
Neisseria Gonorrhea: NEGATIVE

## 2020-10-21 ENCOUNTER — Ambulatory Visit: Payer: Self-pay

## 2020-10-21 NOTE — Telephone Encounter (Signed)
Pt. Reports she is having decreased urinary output, "my urine is very dark.I have also felt a cyst on my vagina." Does not have a PCP. Having right-sided back pain as well. Instructed to go back to ED for evaluation and ask for referral to PCP. Verbalizes understanding.

## 2021-01-27 ENCOUNTER — Encounter (HOSPITAL_COMMUNITY): Payer: Self-pay

## 2021-01-27 ENCOUNTER — Other Ambulatory Visit: Payer: Self-pay

## 2021-01-27 DIAGNOSIS — R202 Paresthesia of skin: Secondary | ICD-10-CM | POA: Insufficient documentation

## 2021-01-27 DIAGNOSIS — S12591A Other nondisplaced fracture of sixth cervical vertebra, initial encounter for closed fracture: Secondary | ICD-10-CM | POA: Insufficient documentation

## 2021-01-27 DIAGNOSIS — M62830 Muscle spasm of back: Secondary | ICD-10-CM | POA: Insufficient documentation

## 2021-01-27 DIAGNOSIS — N6452 Nipple discharge: Secondary | ICD-10-CM | POA: Insufficient documentation

## 2021-01-27 DIAGNOSIS — Y92410 Unspecified street and highway as the place of occurrence of the external cause: Secondary | ICD-10-CM | POA: Insufficient documentation
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
## 2021-01-28 ENCOUNTER — Emergency Department (HOSPITAL_COMMUNITY): Payer: Self-pay

## 2021-01-28 ENCOUNTER — Emergency Department (HOSPITAL_COMMUNITY)
Admission: EM | Admit: 2021-01-28 | Discharge: 2021-01-28 | Disposition: A | Discharge status: Home / Self Care | Payer: Self-pay | Attending: Emergency Medicine | Admitting: Emergency Medicine

## 2021-01-28 DIAGNOSIS — S12591A Other nondisplaced fracture of sixth cervical vertebra, initial encounter for closed fracture: Secondary | ICD-10-CM

## 2021-01-28 LAB — POC URINE PREG, ED: Preg Test, Ur: NEGATIVE
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
## 2021-01-28 NOTE — ED Notes (Signed)
Pt ambulatory to car and retrieved cervical collar. Walked back to ER waiting area. Pt stating her legs are numb. Pt able to transition to wheelchair and then to bed. Has normal sensation.

## 2021-05-05 ENCOUNTER — Other Ambulatory Visit: Payer: Self-pay

## 2021-05-05 DIAGNOSIS — L03115 Cellulitis of right lower limb: Secondary | ICD-10-CM | POA: Insufficient documentation

## 2021-05-05 DIAGNOSIS — W57XXXA Bitten or stung by nonvenomous insect and other nonvenomous arthropods, initial encounter: Secondary | ICD-10-CM | POA: Insufficient documentation

## 2021-05-06 ENCOUNTER — Other Ambulatory Visit: Payer: Self-pay

## 2021-05-06 ENCOUNTER — Emergency Department (HOSPITAL_COMMUNITY)
Admission: EM | Admit: 2021-05-06 | Discharge: 2021-05-06 | Disposition: A | Discharge status: Home / Self Care | Payer: Medicaid Other | Attending: Physician Assistant | Admitting: Physician Assistant

## 2021-05-06 ENCOUNTER — Encounter (HOSPITAL_COMMUNITY): Payer: Self-pay | Admitting: Emergency Medicine

## 2021-05-06 DIAGNOSIS — L03115 Cellulitis of right lower limb: Secondary | ICD-10-CM

## 2021-05-06 MED ORDER — CEPHALEXIN 500 MG PO CAPS: ORAL_CAPSULE | ORAL | 0 refills | Status: AC

## 2021-05-06 MED ORDER — BACITRACIN ZINC 500 UNIT/GM EX OINT: 1.0000 "application " | TOPICAL_OINTMENT | Freq: Two times a day (BID) | CUTANEOUS | 0 refills | Status: AC

## 2021-05-06 NOTE — ED Provider Notes (Signed)
[place] EMERGENCY DEPARTMENT Provider Note   CSN: [id number] Arrival date & time: 05/05/21  2349     History Chief Complaint  Patient presents with   Spider Bite    Anita [name] is a 30 y.o. female presents to the Emergency Department complaining of gradual, persistent, progressively worsening infection of the right lower leg onset 1 week ago.  Pt reports she believes she was bitten by a spider as she found one in her house several days later. She reports clear drainage from the site and a burning sensation when it is touched.  Reports applying ice and heat without improvement. No  hx of immunocompromise.   The history is provided by the patient and medical records. No language interpreter was used.      Past Medical History:  Diagnosis Date   Anemia    history   Depression    History of chlamydia    Hormone disorder    Migraine without aura    PCOS (polycystic ovarian syndrome)    Vitamin D deficiency     Patient Active Problem List   Diagnosis Date Noted   History of chlamydia    Migraine without aura     Past Surgical History:  Procedure Laterality Date   BREAST LUMPECTOMY Right 09/23/2018   Procedure: RIGHT BREAST LUMPECTOMY ERAS PATHWAY;  Surgeon: Coralie Keens, MD;  Location: [facility];  Service: General;  Laterality: Right;   COLPOSCOPY     MOUTH SURGERY       OB History     Gravida  0   Para  0   Term  0   Preterm  0   AB  0   Living  0      SAB  0   IAB  0   Ectopic  0   Multiple  0   Live Births  0           Family History  Problem Relation Age of Onset   Fibroids Mother     Social History   Tobacco Use   Smoking status: Never   Smokeless tobacco: Never  Vaping Use   Vaping Use: Never used  Substance Use Topics   Alcohol use: Yes   Drug use: Yes    Types: Marijuana    Comment: Daily    Home Medications Prior to Admission medications   Medication Sig Start Date End  Date Taking? Authorizing Provider  bacitracin ointment Apply 1 application topically 2 (two) times daily. 05/06/21  Yes Irvin Lizama, Jarrett Soho, PA-C  cephALEXin (KEFLEX) 500 MG capsule 1 cap po bid x 7 days 05/06/21  Yes Aneliese Beaudry, Jarrett Soho, PA-C    Allergies    E-mycin [erythromycin] and Doxycycline  Review of Systems   Review of Systems  Constitutional:  Negative for appetite change, diaphoresis, fatigue, fever and unexpected weight change.  HENT:  Negative for mouth sores.   Eyes:  Negative for visual disturbance.  Respiratory:  Negative for cough, chest tightness, shortness of breath and wheezing.   Cardiovascular:  Negative for chest pain.  Gastrointestinal:  Negative for abdominal pain, constipation, diarrhea, nausea and vomiting.  Endocrine: Negative for polydipsia, polyphagia and polyuria.  Genitourinary:  Negative for dysuria, frequency, hematuria and urgency.  Musculoskeletal:  Negative for back pain and neck stiffness.  Skin:  Positive for color change and wound. Negative for rash.  Allergic/Immunologic: Negative for immunocompromised state.  Neurological:  Negative for syncope, light-headedness and headaches.  Hematological:  Does  not bruise/bleed easily.  Psychiatric/Behavioral:  Negative for sleep disturbance. The patient is not nervous/anxious.    Physical Exam Updated Vital Signs BP 118/75 (BP Location: Left Arm)   Pulse 92   Temp 99.1 F (37.3 C) (Oral)   Resp 16   Ht 5' (1.524 m)   Wt 74 kg   LMP 04/18/2021   SpO2 100%   BMI 31.86 kg/m   Physical Exam Vitals and nursing note reviewed.  Constitutional:      General: She is not in acute distress.    Appearance: She is well-developed. She is not ill-appearing.  HENT:     Head: Normocephalic.  Eyes:     General: No scleral icterus.    Conjunctiva/sclera: Conjunctivae normal.  Cardiovascular:     Rate and Rhythm: Normal rate.  Pulmonary:     Effort: Pulmonary effort is normal.  Abdominal:     General:  There is no distension.  Musculoskeletal:        General: Normal range of motion.     Cervical back: Normal range of motion.  Skin:    General: Skin is warm and dry.     Comments: Area of induration and erythema to the right lower leg, no fluctuance  Neurological:     Mental Status: She is alert.  Psychiatric:        Mood and Affect: Mood normal.      ED Results / Procedures / Treatments   Labs (all labs ordered are listed, but only abnormal results are displayed) Labs Reviewed - No data to display  EKG None  Radiology No results found.  Procedures Procedures   Medications Ordered in ED Medications - No data to display  ED Course  I have reviewed the triage vital signs and the nursing notes.  Pertinent labs & imaging results that were available during my care of the patient were reviewed by me and considered in my medical decision making (see chart for details).    MDM Rules/Calculators/A&P                          Pt presents with 1 week of cellulitis.  Well appearing on exam. Afebrile without tachycardia or tachypnea.  No clinical signs of sepsis. Conservative therapies, keflex and bacitracin given.    Final Clinical Impression(s) / ED Diagnoses Final diagnoses:  Cellulitis of right lower extremity    Rx / DC Orders ED Discharge Orders          Ordered    cephALEXin (KEFLEX) 500 MG capsule        05/06/21 0038    bacitracin ointment  2 times daily        05/06/21 0038             Avalynn Bowe, Gwenlyn Perking 05/06/21 3202    Veryl Speak, MD 05/06/21 [phone]

## 2021-05-06 NOTE — Discharge Instructions (Signed)
1. Medications: keflex, bacitracin, usual home medications 2. Treatment: rest, drink plenty of fluids, warm compresses 3. Follow Up: Please followup with your primary doctor in 2-3 days for discussion of your diagnoses and further evaluation after today's visit; if you do not have a primary care doctor use the resource guide provided to find one; Please return to the ER for worsening infection

## 2021-05-06 NOTE — ED Triage Notes (Signed)
Patient reports spider bite at right upper ankle last week , mild redness at site , patient added intermittent lightheadedness/sweats and memory fog.

## 2021-06-12 DIAGNOSIS — Z3009 Encounter for other general counseling and advice on contraception: Secondary | ICD-10-CM | POA: Diagnosis not present

## 2021-06-12 DIAGNOSIS — Z1388 Encounter for screening for disorder due to exposure to contaminants: Secondary | ICD-10-CM | POA: Diagnosis not present

## 2021-06-12 DIAGNOSIS — Z0389 Encounter for observation for other suspected diseases and conditions ruled out: Secondary | ICD-10-CM | POA: Diagnosis not present

## 2021-06-16 DIAGNOSIS — Z3009 Encounter for other general counseling and advice on contraception: Secondary | ICD-10-CM | POA: Diagnosis not present

## 2021-06-16 DIAGNOSIS — Z0389 Encounter for observation for other suspected diseases and conditions ruled out: Secondary | ICD-10-CM | POA: Diagnosis not present

## 2021-06-16 DIAGNOSIS — Z1388 Encounter for screening for disorder due to exposure to contaminants: Secondary | ICD-10-CM | POA: Diagnosis not present

## 2022-03-20 IMAGING — DX DG HAND COMPLETE 3+V*L*
3 series · 3 of 3 positions shown · non-contrast
Comparison: None.

CLINICAL DATA: Injury to the left hand.

EXAM:
LEFT HAND - COMPLETE 3+ VIEW

[hand pa]
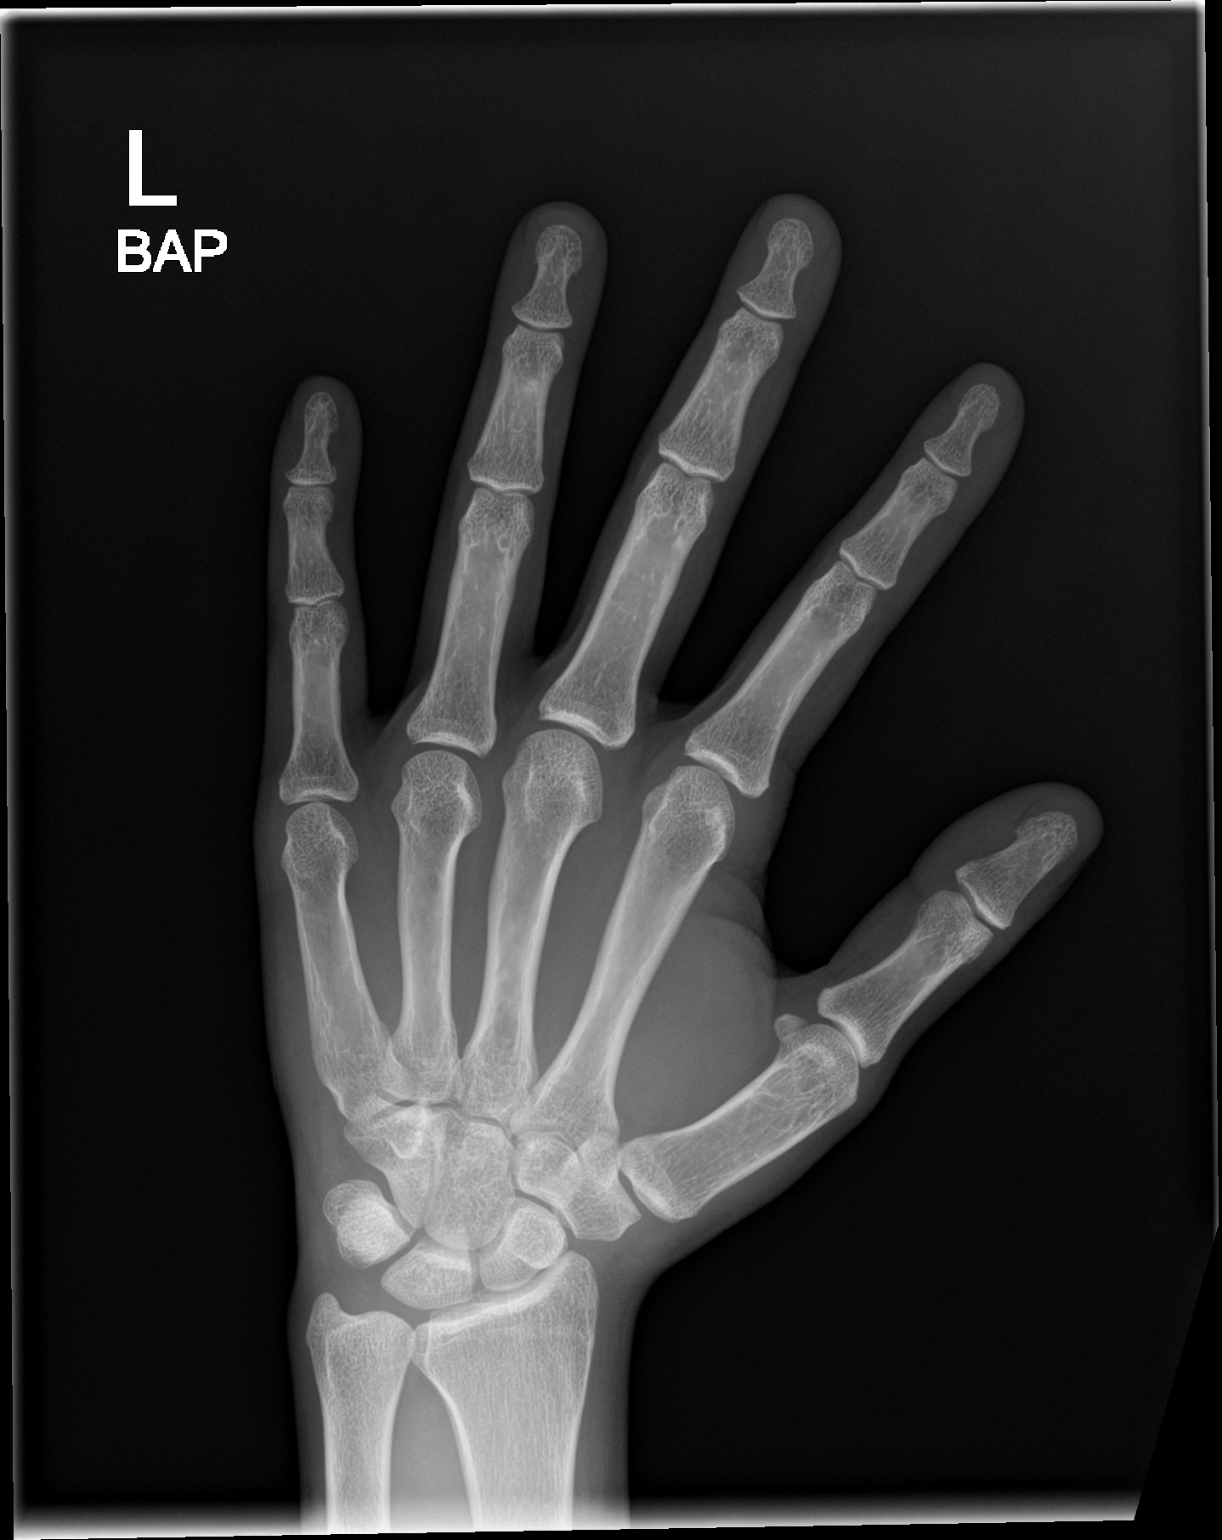

[hand obl]
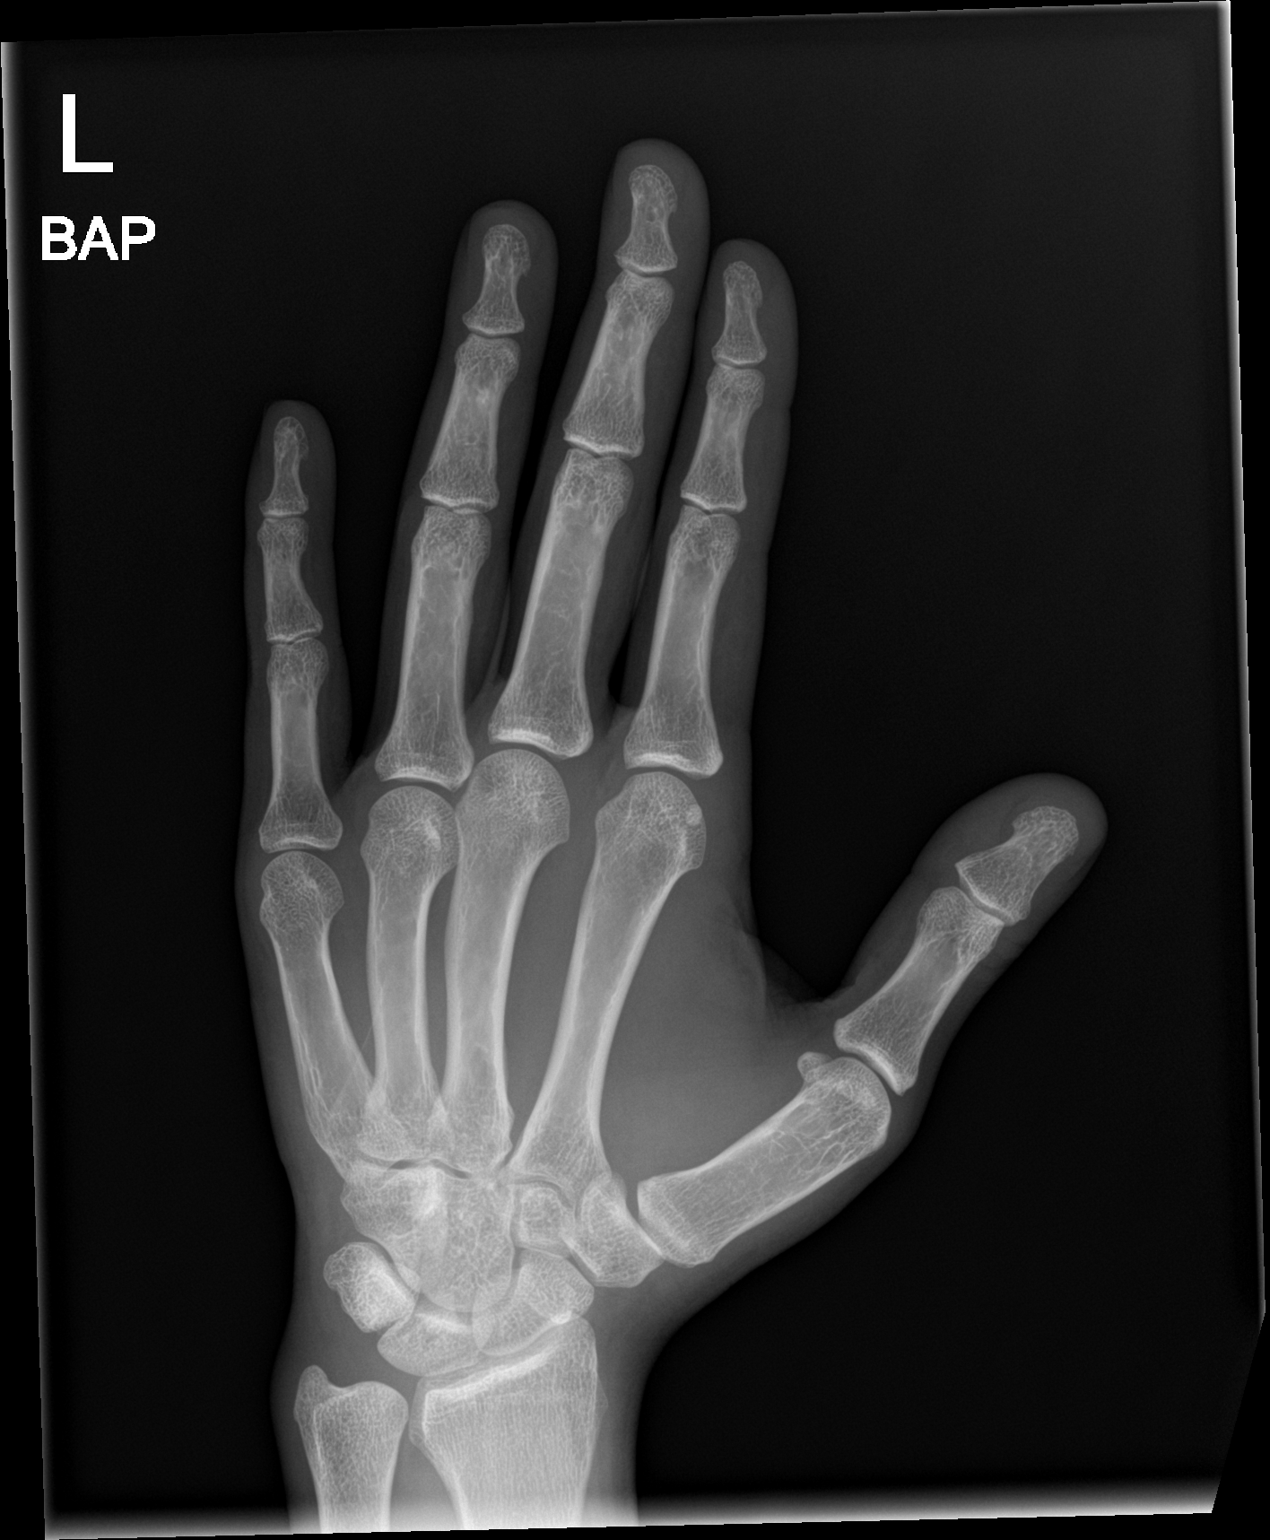

[hand lat]
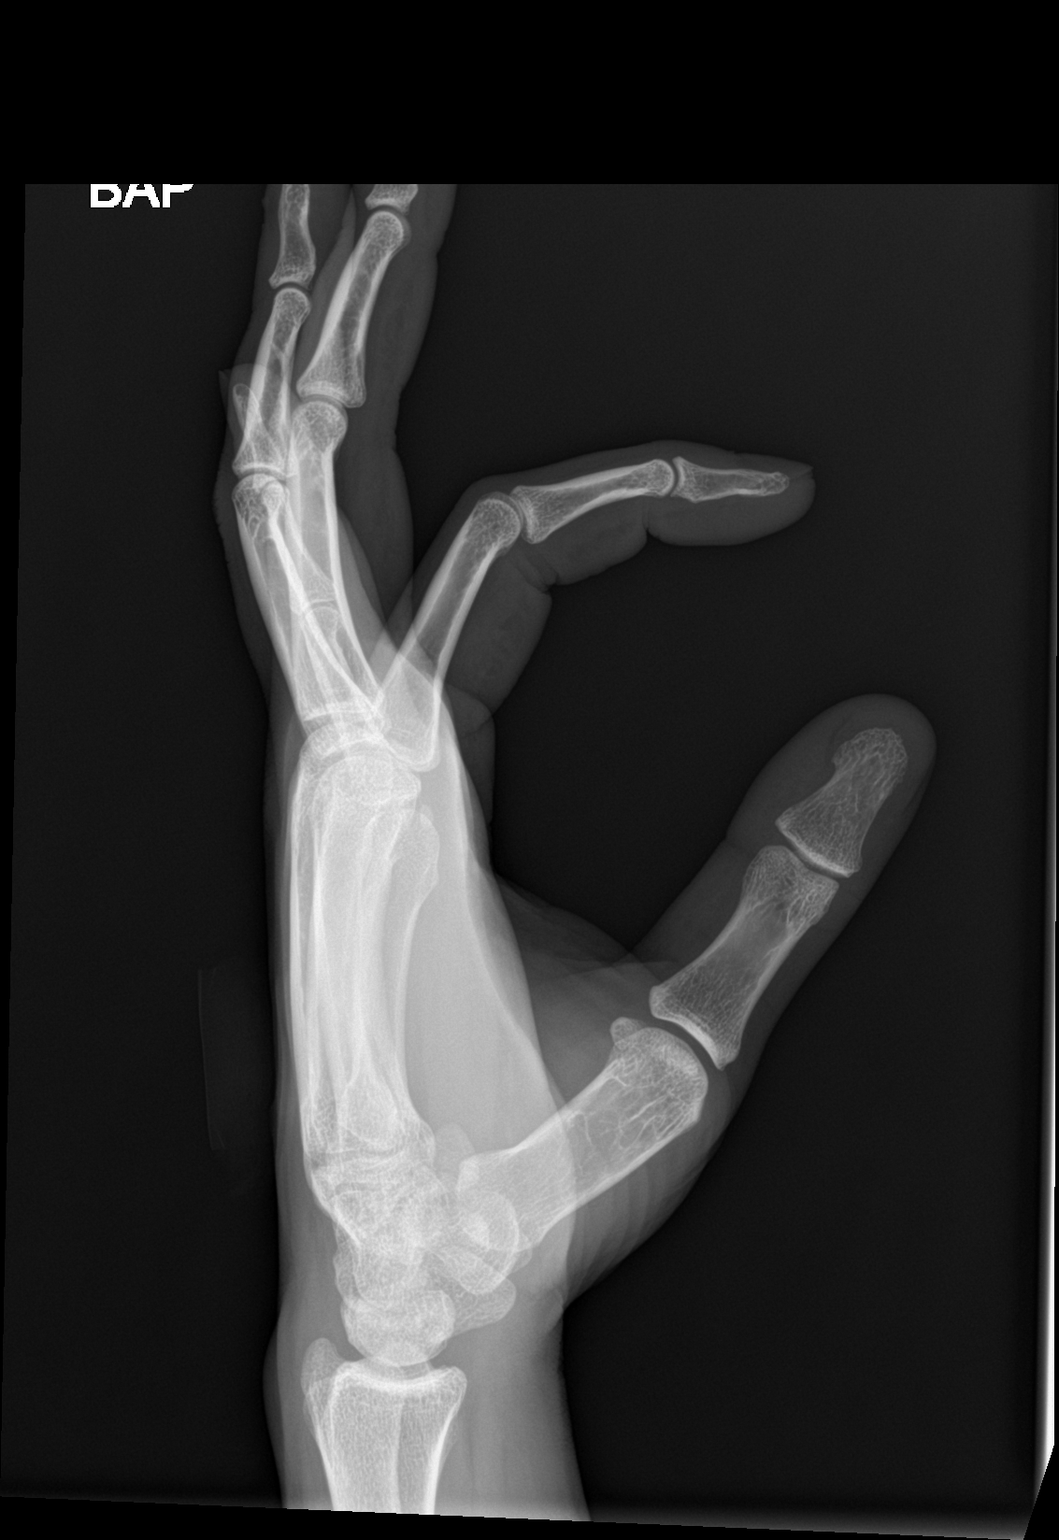

[3 of 3 positions shown; findings below may reference images not displayed]

FINDINGS: There is no evidence of fracture or dislocation. There is no
evidence of arthropathy or other focal bone abnormality. Soft
tissues are unremarkable.
IMPRESSION: Negative.

## 2022-03-22 IMAGING — CT CT RENAL STONE PROTOCOL
3 of 4 series · 9 of 46 positions shown, 16 images · non-contrast
Comparison: April 05, 2020

CLINICAL DATA: Nausea and vomiting.  Flank pain.

EXAM:
CT ABDOMEN AND PELVIS WITHOUT CONTRAST
TECHNIQUE: Multidetector CT imaging of the abdomen and pelvis was performed
following the standard protocol without IV contrast.

[Series 5: lung bases · axial · 0.60mm/px · z∈[-87,-7]mm · 5 of 25 slices shown, 10 images]
[im 5/25  soft-tissue]
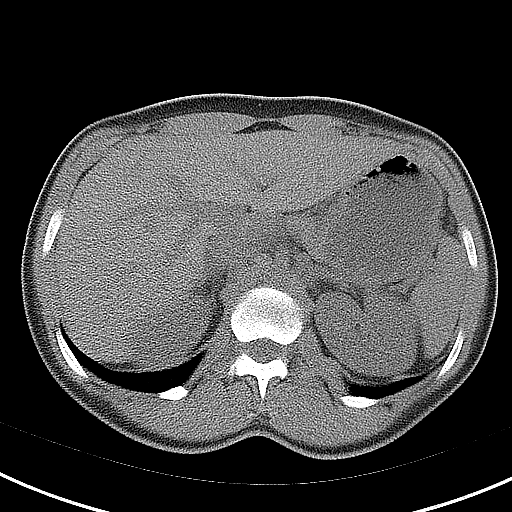
[im 5/25  bone]
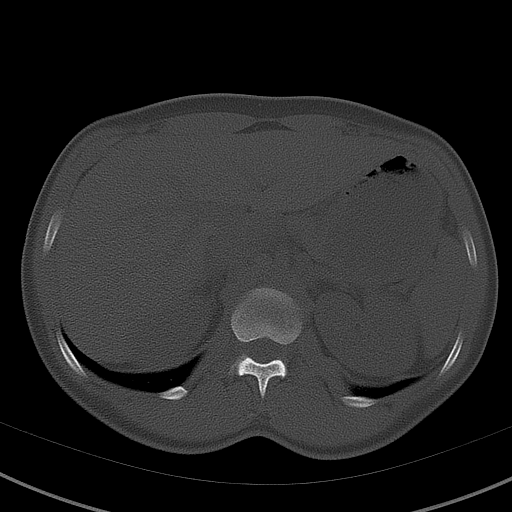
[im 9/25  soft-tissue]
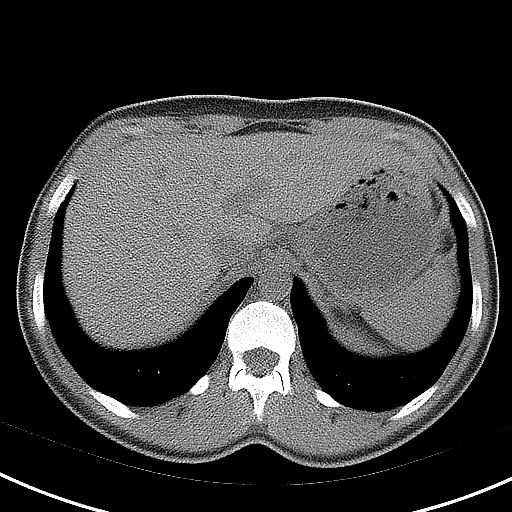
[im 9/25  lung]
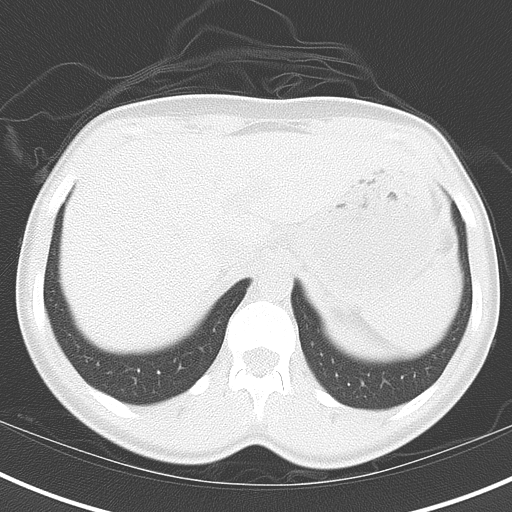
[im 13/25  soft-tissue]
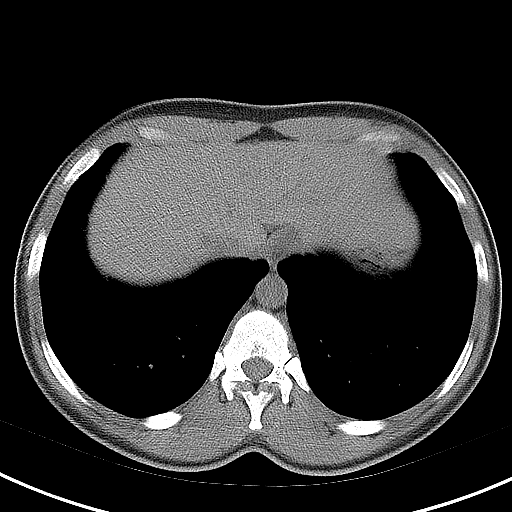
[im 13/25  lung]
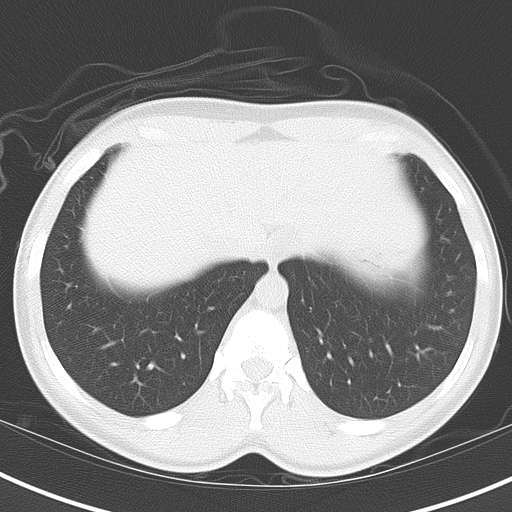
[im 17/25  soft-tissue]
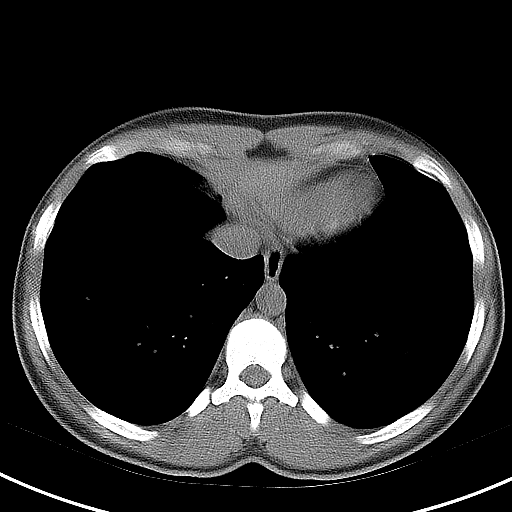
[im 17/25  lung]
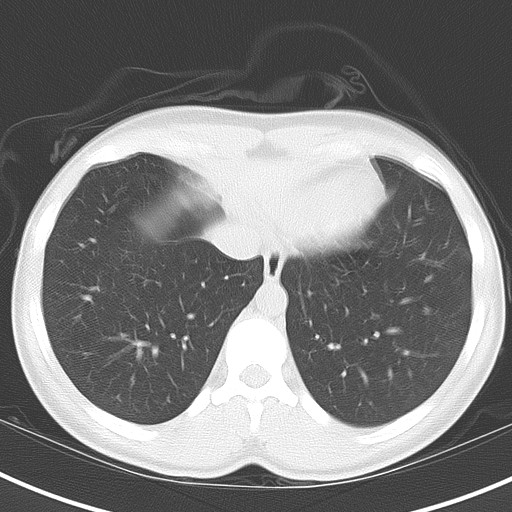
[im 21/25  soft-tissue]
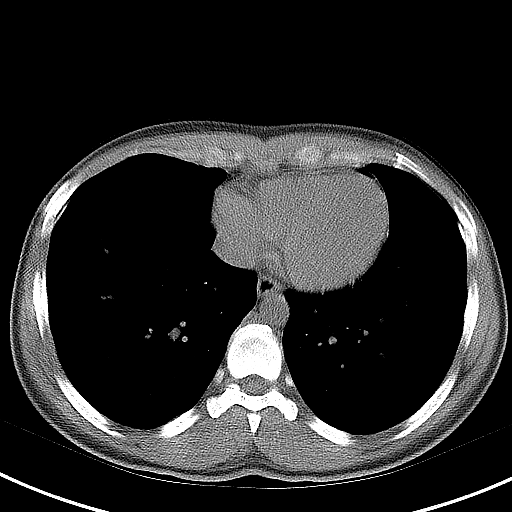
[im 21/25  lung]
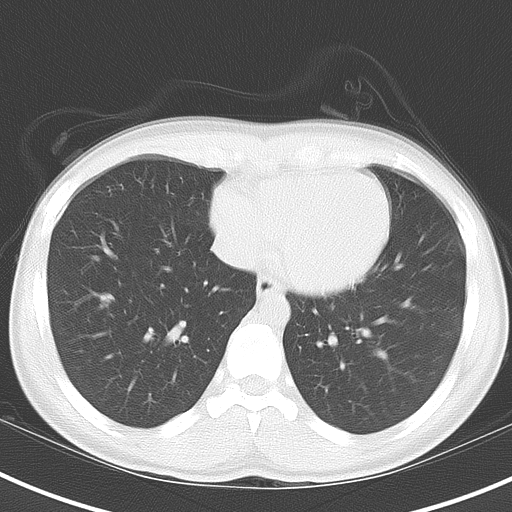

[Series 6: coronal · coronal · 0.67mm/px · 3 of 123 slices shown, 4 images]
[im 41/123  soft-tissue]
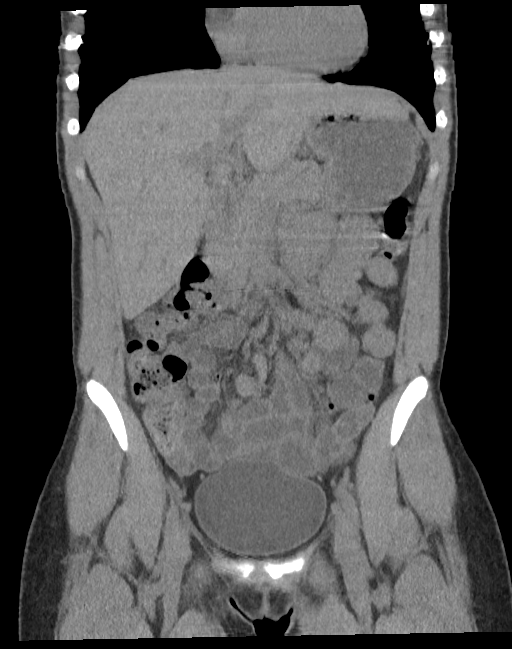
[im 55/123  soft-tissue]
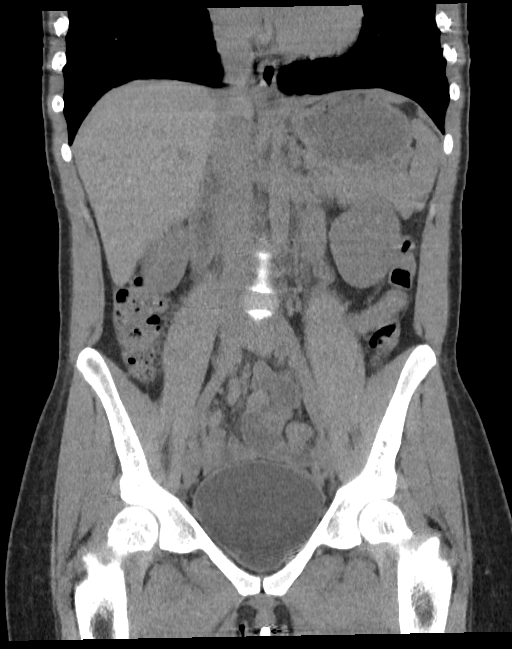
[im 55/123  bone]
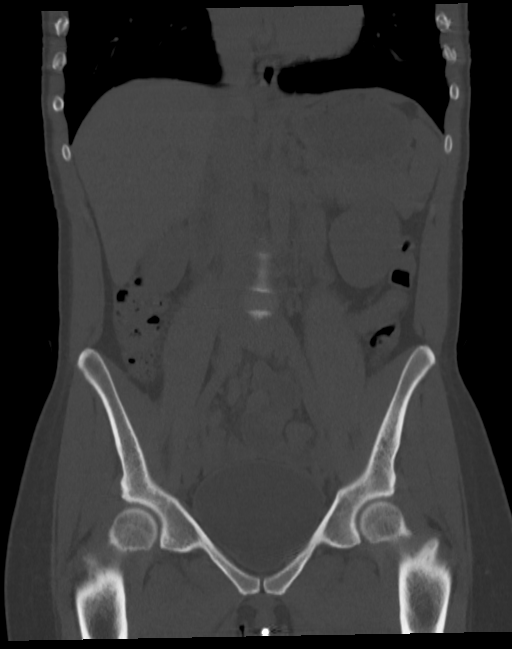
[im 68/123  soft-tissue]
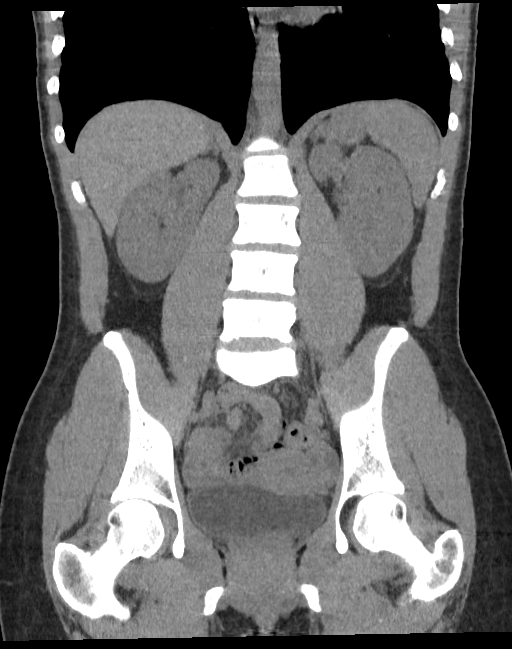

[Series 7: sagittal · sagittal · 0.49mm/px · 1 of 176 slices shown, 2 images]
[im 59/176  soft-tissue]
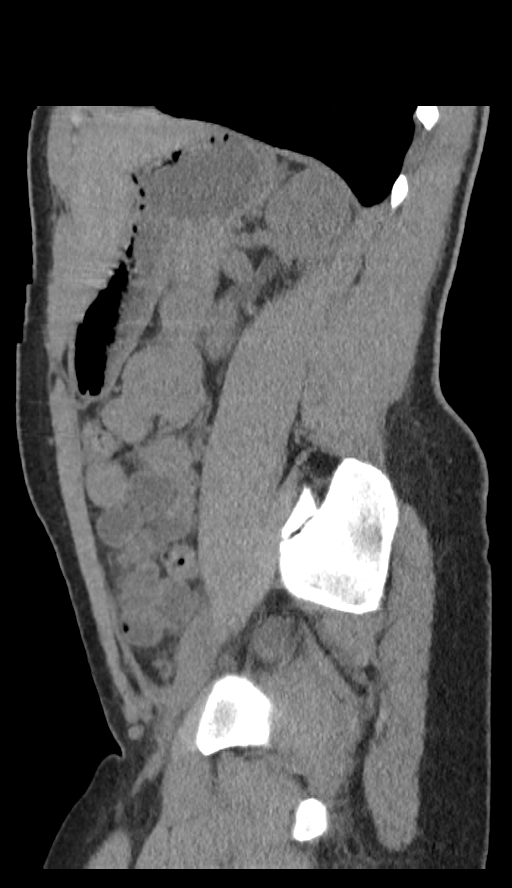
[im 59/176  bone]
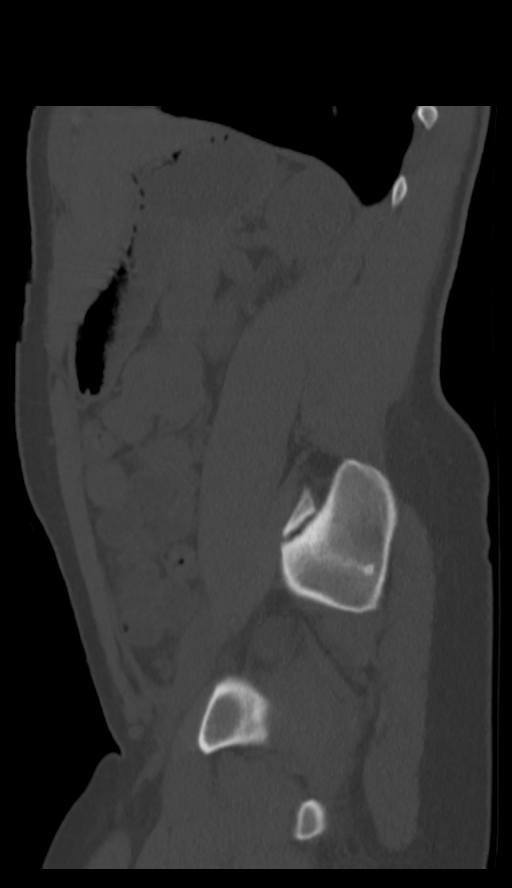

[9 of 46 positions shown; findings below may reference images not displayed]

FINDINGS: Lower chest: No acute abnormality.

Hepatobiliary: High attenuation in the posterior gallbladder could
represent sludge or stones. No wall thickening or pericholecystic
fluid. The liver is unremarkable.

Pancreas: Unremarkable. No pancreatic ductal dilatation or
surrounding inflammatory changes.

Spleen: Normal in size without focal abnormality.

Adrenals/Urinary Tract: Adrenal glands are unremarkable. Kidneys are
normal, without renal calculi, focal lesion, or hydronephrosis.
Bladder is unremarkable.

Stomach/Bowel: Stomach is within normal limits. Appendix appears
normal. No evidence of bowel wall thickening, distention, or
inflammatory changes.

Vascular/Lymphatic: No significant vascular findings are present. No
enlarged abdominal or pelvic lymph nodes.

Reproductive: Uterus and bilateral adnexa are unremarkable.

Other: No abdominal wall hernia or abnormality. No abdominopelvic
ascites.

Musculoskeletal: No acute or significant osseous findings.
IMPRESSION: 1. No acute abnormalities identified.
2. Possible sludge or stones in the gallbladder without wall
thickening or pericholecystic fluid.

## 2022-11-13 DIAGNOSIS — Z3A12 12 weeks gestation of pregnancy: Secondary | ICD-10-CM | POA: Diagnosis not present

## 2022-11-13 DIAGNOSIS — R1030 Lower abdominal pain, unspecified: Secondary | ICD-10-CM | POA: Diagnosis not present

## 2022-11-13 DIAGNOSIS — R112 Nausea with vomiting, unspecified: Secondary | ICD-10-CM | POA: Diagnosis not present

## 2022-11-18 DIAGNOSIS — Z3401 Encounter for supervision of normal first pregnancy, first trimester: Secondary | ICD-10-CM | POA: Diagnosis not present

## 2022-11-18 DIAGNOSIS — R350 Frequency of micturition: Secondary | ICD-10-CM | POA: Diagnosis not present

## 2022-11-18 DIAGNOSIS — Z3482 Encounter for supervision of other normal pregnancy, second trimester: Secondary | ICD-10-CM | POA: Diagnosis not present

## 2022-11-18 DIAGNOSIS — Z113 Encounter for screening for infections with a predominantly sexual mode of transmission: Secondary | ICD-10-CM | POA: Diagnosis not present

## 2022-11-18 DIAGNOSIS — Z3492 Encounter for supervision of normal pregnancy, unspecified, second trimester: Secondary | ICD-10-CM | POA: Diagnosis not present

## 2022-11-18 DIAGNOSIS — F32A Depression, unspecified: Secondary | ICD-10-CM | POA: Diagnosis not present

## 2022-11-18 DIAGNOSIS — Z1331 Encounter for screening for depression: Secondary | ICD-10-CM | POA: Diagnosis not present

## 2022-11-18 DIAGNOSIS — F418 Other specified anxiety disorders: Secondary | ICD-10-CM | POA: Diagnosis not present

## 2022-12-02 DIAGNOSIS — Z113 Encounter for screening for infections with a predominantly sexual mode of transmission: Secondary | ICD-10-CM | POA: Diagnosis not present

## 2022-12-02 DIAGNOSIS — Z1331 Encounter for screening for depression: Secondary | ICD-10-CM | POA: Diagnosis not present

## 2022-12-02 DIAGNOSIS — N898 Other specified noninflammatory disorders of vagina: Secondary | ICD-10-CM | POA: Diagnosis not present

## 2022-12-02 DIAGNOSIS — R350 Frequency of micturition: Secondary | ICD-10-CM | POA: Diagnosis not present

## 2022-12-02 DIAGNOSIS — Z1371 Encounter for nonprocreative screening for genetic disease carrier status: Secondary | ICD-10-CM | POA: Diagnosis not present

## 2022-12-02 DIAGNOSIS — Z124 Encounter for screening for malignant neoplasm of cervix: Secondary | ICD-10-CM | POA: Diagnosis not present

## 2022-12-02 DIAGNOSIS — F418 Other specified anxiety disorders: Secondary | ICD-10-CM | POA: Diagnosis not present

## 2022-12-02 DIAGNOSIS — Z3482 Encounter for supervision of other normal pregnancy, second trimester: Secondary | ICD-10-CM | POA: Diagnosis not present

## 2022-12-02 DIAGNOSIS — Z3402 Encounter for supervision of normal first pregnancy, second trimester: Secondary | ICD-10-CM | POA: Diagnosis not present

## 2022-12-19 DIAGNOSIS — R1031 Right lower quadrant pain: Secondary | ICD-10-CM | POA: Diagnosis not present

## 2022-12-19 DIAGNOSIS — R1033 Periumbilical pain: Secondary | ICD-10-CM | POA: Diagnosis not present

## 2022-12-19 DIAGNOSIS — R109 Unspecified abdominal pain: Secondary | ICD-10-CM | POA: Diagnosis not present

## 2022-12-19 DIAGNOSIS — R110 Nausea: Secondary | ICD-10-CM | POA: Diagnosis not present

## 2022-12-19 DIAGNOSIS — R102 Pelvic and perineal pain: Secondary | ICD-10-CM | POA: Diagnosis not present

## 2022-12-19 DIAGNOSIS — R1032 Left lower quadrant pain: Secondary | ICD-10-CM | POA: Diagnosis not present

## 2022-12-19 DIAGNOSIS — Z881 Allergy status to other antibiotic agents status: Secondary | ICD-10-CM | POA: Diagnosis not present

## 2022-12-19 DIAGNOSIS — O26892 Other specified pregnancy related conditions, second trimester: Secondary | ICD-10-CM | POA: Diagnosis not present

## 2022-12-19 DIAGNOSIS — Z3A20 20 weeks gestation of pregnancy: Secondary | ICD-10-CM | POA: Diagnosis not present

## 2022-12-19 DIAGNOSIS — Z3A19 19 weeks gestation of pregnancy: Secondary | ICD-10-CM | POA: Diagnosis not present

## 2022-12-22 DIAGNOSIS — R519 Headache, unspecified: Secondary | ICD-10-CM | POA: Diagnosis not present

## 2022-12-22 DIAGNOSIS — Z3402 Encounter for supervision of normal first pregnancy, second trimester: Secondary | ICD-10-CM | POA: Diagnosis not present

## 2022-12-25 DIAGNOSIS — R519 Headache, unspecified: Secondary | ICD-10-CM | POA: Diagnosis not present

## 2022-12-25 DIAGNOSIS — Z79899 Other long term (current) drug therapy: Secondary | ICD-10-CM | POA: Diagnosis not present

## 2022-12-25 DIAGNOSIS — O26892 Other specified pregnancy related conditions, second trimester: Secondary | ICD-10-CM | POA: Diagnosis not present

## 2022-12-25 DIAGNOSIS — O99519 Diseases of the respiratory system complicating pregnancy, unspecified trimester: Secondary | ICD-10-CM | POA: Diagnosis not present

## 2022-12-25 DIAGNOSIS — Z3A00 Weeks of gestation of pregnancy not specified: Secondary | ICD-10-CM | POA: Diagnosis not present

## 2022-12-25 DIAGNOSIS — Z881 Allergy status to other antibiotic agents status: Secondary | ICD-10-CM | POA: Diagnosis not present

## 2022-12-25 DIAGNOSIS — U071 COVID-19: Secondary | ICD-10-CM | POA: Diagnosis not present

## 2022-12-25 DIAGNOSIS — O98512 Other viral diseases complicating pregnancy, second trimester: Secondary | ICD-10-CM | POA: Diagnosis not present

## 2022-12-25 DIAGNOSIS — Z3A20 20 weeks gestation of pregnancy: Secondary | ICD-10-CM | POA: Diagnosis not present

## 2022-12-25 DIAGNOSIS — R42 Dizziness and giddiness: Secondary | ICD-10-CM | POA: Diagnosis not present

## 2023-01-20 DIAGNOSIS — Z3402 Encounter for supervision of normal first pregnancy, second trimester: Secondary | ICD-10-CM | POA: Diagnosis not present

## 2023-02-17 DIAGNOSIS — Z8659 Personal history of other mental and behavioral disorders: Secondary | ICD-10-CM | POA: Diagnosis not present

## 2023-02-17 DIAGNOSIS — Z3402 Encounter for supervision of normal first pregnancy, second trimester: Secondary | ICD-10-CM | POA: Diagnosis not present

## 2023-02-17 DIAGNOSIS — F419 Anxiety disorder, unspecified: Secondary | ICD-10-CM | POA: Diagnosis not present

## 2023-02-17 DIAGNOSIS — Z3482 Encounter for supervision of other normal pregnancy, second trimester: Secondary | ICD-10-CM | POA: Diagnosis not present

## 2023-03-03 DIAGNOSIS — Z23 Encounter for immunization: Secondary | ICD-10-CM | POA: Diagnosis not present

## 2023-03-03 DIAGNOSIS — Z3403 Encounter for supervision of normal first pregnancy, third trimester: Secondary | ICD-10-CM | POA: Diagnosis not present

## 2023-03-17 DIAGNOSIS — Z8659 Personal history of other mental and behavioral disorders: Secondary | ICD-10-CM | POA: Diagnosis not present

## 2023-03-17 DIAGNOSIS — Z3403 Encounter for supervision of normal first pregnancy, third trimester: Secondary | ICD-10-CM | POA: Diagnosis not present

## 2023-03-17 DIAGNOSIS — F418 Other specified anxiety disorders: Secondary | ICD-10-CM | POA: Diagnosis not present

## 2023-03-31 DIAGNOSIS — F418 Other specified anxiety disorders: Secondary | ICD-10-CM | POA: Diagnosis not present

## 2023-03-31 DIAGNOSIS — Z8659 Personal history of other mental and behavioral disorders: Secondary | ICD-10-CM | POA: Diagnosis not present

## 2023-03-31 DIAGNOSIS — Z3493 Encounter for supervision of normal pregnancy, unspecified, third trimester: Secondary | ICD-10-CM | POA: Diagnosis not present

## 2023-04-03 DIAGNOSIS — R42 Dizziness and giddiness: Secondary | ICD-10-CM | POA: Diagnosis not present

## 2023-04-03 DIAGNOSIS — E860 Dehydration: Secondary | ICD-10-CM | POA: Diagnosis not present

## 2023-04-03 DIAGNOSIS — Z3A34 34 weeks gestation of pregnancy: Secondary | ICD-10-CM | POA: Diagnosis not present

## 2023-04-03 DIAGNOSIS — O99283 Endocrine, nutritional and metabolic diseases complicating pregnancy, third trimester: Secondary | ICD-10-CM | POA: Diagnosis not present

## 2023-04-03 DIAGNOSIS — R102 Pelvic and perineal pain: Secondary | ICD-10-CM | POA: Diagnosis not present

## 2023-04-03 DIAGNOSIS — R0789 Other chest pain: Secondary | ICD-10-CM | POA: Diagnosis not present

## 2023-04-03 DIAGNOSIS — O26893 Other specified pregnancy related conditions, third trimester: Secondary | ICD-10-CM | POA: Diagnosis not present

## 2023-04-03 DIAGNOSIS — R55 Syncope and collapse: Secondary | ICD-10-CM | POA: Diagnosis not present

## 2023-04-03 DIAGNOSIS — O99891 Other specified diseases and conditions complicating pregnancy: Secondary | ICD-10-CM | POA: Diagnosis not present

## 2023-04-03 DIAGNOSIS — Z87891 Personal history of nicotine dependence: Secondary | ICD-10-CM | POA: Diagnosis not present

## 2023-04-03 DIAGNOSIS — R079 Chest pain, unspecified: Secondary | ICD-10-CM | POA: Diagnosis not present

## 2023-04-03 DIAGNOSIS — R0602 Shortness of breath: Secondary | ICD-10-CM | POA: Diagnosis not present

## 2023-04-14 DIAGNOSIS — Z3403 Encounter for supervision of normal first pregnancy, third trimester: Secondary | ICD-10-CM | POA: Diagnosis not present

## 2023-04-20 DIAGNOSIS — Z3493 Encounter for supervision of normal pregnancy, unspecified, third trimester: Secondary | ICD-10-CM | POA: Diagnosis not present

## 2023-04-20 DIAGNOSIS — Z3685 Encounter for antenatal screening for Streptococcus B: Secondary | ICD-10-CM | POA: Diagnosis not present

## 2023-04-29 DIAGNOSIS — Z113 Encounter for screening for infections with a predominantly sexual mode of transmission: Secondary | ICD-10-CM | POA: Diagnosis not present

## 2023-04-29 DIAGNOSIS — N898 Other specified noninflammatory disorders of vagina: Secondary | ICD-10-CM | POA: Diagnosis not present

## 2023-04-29 DIAGNOSIS — Z130 Encounter for screening for diseases of the blood and blood-forming organs and certain disorders involving the immune mechanism: Secondary | ICD-10-CM | POA: Diagnosis not present

## 2023-04-29 DIAGNOSIS — Z3493 Encounter for supervision of normal pregnancy, unspecified, third trimester: Secondary | ICD-10-CM | POA: Diagnosis not present

## 2023-05-03 DIAGNOSIS — Z3403 Encounter for supervision of normal first pregnancy, third trimester: Secondary | ICD-10-CM | POA: Diagnosis not present

## 2023-05-07 DIAGNOSIS — R519 Headache, unspecified: Secondary | ICD-10-CM | POA: Diagnosis not present

## 2023-05-07 DIAGNOSIS — B9689 Other specified bacterial agents as the cause of diseases classified elsewhere: Secondary | ICD-10-CM | POA: Diagnosis not present

## 2023-05-07 DIAGNOSIS — R350 Frequency of micturition: Secondary | ICD-10-CM | POA: Diagnosis not present

## 2023-05-07 DIAGNOSIS — R300 Dysuria: Secondary | ICD-10-CM | POA: Diagnosis not present

## 2023-05-07 DIAGNOSIS — N760 Acute vaginitis: Secondary | ICD-10-CM | POA: Diagnosis not present

## 2023-05-10 DIAGNOSIS — Z3403 Encounter for supervision of normal first pregnancy, third trimester: Secondary | ICD-10-CM | POA: Diagnosis not present

## 2023-05-14 DIAGNOSIS — O471 False labor at or after 37 completed weeks of gestation: Secondary | ICD-10-CM | POA: Diagnosis not present

## 2023-05-14 DIAGNOSIS — O134 Gestational [pregnancy-induced] hypertension without significant proteinuria, complicating childbirth: Secondary | ICD-10-CM | POA: Diagnosis not present

## 2023-05-14 DIAGNOSIS — D259 Leiomyoma of uterus, unspecified: Secondary | ICD-10-CM | POA: Diagnosis not present

## 2023-05-14 DIAGNOSIS — Z3A40 40 weeks gestation of pregnancy: Secondary | ICD-10-CM | POA: Diagnosis not present

## 2023-05-14 DIAGNOSIS — Z87891 Personal history of nicotine dependence: Secondary | ICD-10-CM | POA: Diagnosis not present

## 2023-05-14 DIAGNOSIS — O3413 Maternal care for benign tumor of corpus uteri, third trimester: Secondary | ICD-10-CM | POA: Diagnosis not present

## 2023-05-19 DIAGNOSIS — R030 Elevated blood-pressure reading, without diagnosis of hypertension: Secondary | ICD-10-CM | POA: Diagnosis not present

## 2023-05-19 DIAGNOSIS — R350 Frequency of micturition: Secondary | ICD-10-CM | POA: Diagnosis not present

## 2023-05-19 DIAGNOSIS — Z8759 Personal history of other complications of pregnancy, childbirth and the puerperium: Secondary | ICD-10-CM | POA: Diagnosis not present

## 2023-05-19 DIAGNOSIS — R7401 Elevation of levels of liver transaminase levels: Secondary | ICD-10-CM | POA: Diagnosis not present

## 2023-05-19 DIAGNOSIS — R609 Edema, unspecified: Secondary | ICD-10-CM | POA: Diagnosis not present

## 2023-05-19 DIAGNOSIS — R519 Headache, unspecified: Secondary | ICD-10-CM | POA: Diagnosis not present

## 2023-05-27 DIAGNOSIS — Z3009 Encounter for other general counseling and advice on contraception: Secondary | ICD-10-CM | POA: Diagnosis not present

## 2023-05-27 DIAGNOSIS — Z8759 Personal history of other complications of pregnancy, childbirth and the puerperium: Secondary | ICD-10-CM | POA: Diagnosis not present

## 2023-06-28 DIAGNOSIS — N6314 Unspecified lump in the right breast, lower inner quadrant: Secondary | ICD-10-CM | POA: Diagnosis not present

## 2023-07-08 DIAGNOSIS — D241 Benign neoplasm of right breast: Secondary | ICD-10-CM | POA: Diagnosis not present

## 2023-07-08 DIAGNOSIS — N6315 Unspecified lump in the right breast, overlapping quadrants: Secondary | ICD-10-CM | POA: Diagnosis not present

## 2023-07-08 DIAGNOSIS — R92343 Mammographic extreme density, bilateral breasts: Secondary | ICD-10-CM | POA: Diagnosis not present

## 2023-10-15 DIAGNOSIS — R102 Pelvic and perineal pain: Secondary | ICD-10-CM | POA: Diagnosis not present

## 2023-10-15 DIAGNOSIS — N921 Excessive and frequent menstruation with irregular cycle: Secondary | ICD-10-CM | POA: Diagnosis not present

## 2023-10-15 DIAGNOSIS — N939 Abnormal uterine and vaginal bleeding, unspecified: Secondary | ICD-10-CM | POA: Diagnosis not present

## 2023-11-19 DIAGNOSIS — Z113 Encounter for screening for infections with a predominantly sexual mode of transmission: Secondary | ICD-10-CM | POA: Diagnosis not present

## 2023-11-19 DIAGNOSIS — A5619 Other chlamydial genitourinary infection: Secondary | ICD-10-CM | POA: Diagnosis not present

## 2023-11-19 DIAGNOSIS — Z203 Contact with and (suspected) exposure to rabies: Secondary | ICD-10-CM | POA: Diagnosis not present

## 2023-11-19 DIAGNOSIS — N760 Acute vaginitis: Secondary | ICD-10-CM | POA: Diagnosis not present

## 2023-12-10 DIAGNOSIS — D485 Neoplasm of uncertain behavior of skin: Secondary | ICD-10-CM | POA: Diagnosis not present

## 2023-12-10 DIAGNOSIS — D1801 Hemangioma of skin and subcutaneous tissue: Secondary | ICD-10-CM | POA: Diagnosis not present

## 2023-12-27 DIAGNOSIS — N3000 Acute cystitis without hematuria: Secondary | ICD-10-CM | POA: Diagnosis not present

## 2023-12-27 DIAGNOSIS — Z3202 Encounter for pregnancy test, result negative: Secondary | ICD-10-CM | POA: Diagnosis not present

## 2024-03-24 DIAGNOSIS — M5441 Lumbago with sciatica, right side: Secondary | ICD-10-CM | POA: Diagnosis not present

## 2024-03-24 DIAGNOSIS — G8929 Other chronic pain: Secondary | ICD-10-CM | POA: Diagnosis not present

## 2024-04-17 DIAGNOSIS — Z1321 Encounter for screening for nutritional disorder: Secondary | ICD-10-CM | POA: Diagnosis not present

## 2024-04-17 DIAGNOSIS — Z113 Encounter for screening for infections with a predominantly sexual mode of transmission: Secondary | ICD-10-CM | POA: Diagnosis not present

## 2024-04-17 DIAGNOSIS — Z124 Encounter for screening for malignant neoplasm of cervix: Secondary | ICD-10-CM | POA: Diagnosis not present

## 2024-04-17 DIAGNOSIS — R87612 Low grade squamous intraepithelial lesion on cytologic smear of cervix (LGSIL): Secondary | ICD-10-CM | POA: Diagnosis not present

## 2024-04-17 DIAGNOSIS — Z01419 Encounter for gynecological examination (general) (routine) without abnormal findings: Secondary | ICD-10-CM | POA: Diagnosis not present

## 2024-05-24 DIAGNOSIS — R87612 Low grade squamous intraepithelial lesion on cytologic smear of cervix (LGSIL): Secondary | ICD-10-CM | POA: Diagnosis not present

## 2024-05-24 DIAGNOSIS — Z3200 Encounter for pregnancy test, result unknown: Secondary | ICD-10-CM | POA: Diagnosis not present

## 2024-09-13 DIAGNOSIS — Z113 Encounter for screening for infections with a predominantly sexual mode of transmission: Secondary | ICD-10-CM | POA: Diagnosis not present

## 2024-09-13 DIAGNOSIS — B379 Candidiasis, unspecified: Secondary | ICD-10-CM | POA: Diagnosis not present

## 2024-10-13 DIAGNOSIS — M25521 Pain in right elbow: Secondary | ICD-10-CM | POA: Diagnosis not present

## 2024-10-13 DIAGNOSIS — Z7251 High risk heterosexual behavior: Secondary | ICD-10-CM | POA: Diagnosis not present
